# Patient Record
Sex: Male | Born: 1950
Health system: Southern US, Community
[De-identification: ages and names within clinical notes are randomized; demographics above are authoritative.]

## PROBLEM LIST (undated history)

## (undated) DIAGNOSIS — F419 Anxiety disorder, unspecified: Secondary | ICD-10-CM

## (undated) DIAGNOSIS — E119 Type 2 diabetes mellitus without complications: Secondary | ICD-10-CM

## (undated) DIAGNOSIS — M3501 Sicca syndrome with keratoconjunctivitis: Secondary | ICD-10-CM

## (undated) DIAGNOSIS — H16221 Keratoconjunctivitis sicca, not specified as Sjogren's, right eye: Secondary | ICD-10-CM

## (undated) DIAGNOSIS — H169 Unspecified keratitis: Secondary | ICD-10-CM

## (undated) DIAGNOSIS — J302 Other seasonal allergic rhinitis: Secondary | ICD-10-CM

## (undated) DIAGNOSIS — J189 Pneumonia, unspecified organism: Secondary | ICD-10-CM

## (undated) HISTORY — PX: KNEE ARTHROSCOPY: SUR90

## (undated) HISTORY — DX: Type 2 diabetes mellitus without complications: E11.9

## (undated) HISTORY — PX: CYST REMOVAL LEG: SHX6280

## (undated) HISTORY — PX: RECONSTRUCTION OF NOSE: SHX2301

---
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
## 1981-11-27 HISTORY — PX: REPAIR TENDONS LEG: SUR1210

## 1994-11-27 HISTORY — PX: HAND SURGERY: SHX662

## 1996-11-27 HISTORY — PX: ANAL FISSURE REPAIR: SHX2312

## 2002-07-11 ENCOUNTER — Ambulatory Visit (HOSPITAL_COMMUNITY): Admission: RE | Admit: 2002-07-11 | Discharge: 2002-07-11 | Payer: Self-pay | Admitting: *Deleted

## 2002-07-11 ENCOUNTER — Encounter: Payer: Self-pay | Admitting: *Deleted

## 2002-08-13 ENCOUNTER — Encounter: Payer: Self-pay | Admitting: *Deleted

## 2002-08-13 ENCOUNTER — Ambulatory Visit (HOSPITAL_COMMUNITY): Admission: RE | Admit: 2002-08-13 | Discharge: 2002-08-13 | Payer: Self-pay | Admitting: *Deleted

## 2002-08-29 ENCOUNTER — Encounter: Admission: RE | Admit: 2002-08-29 | Discharge: 2002-08-29 | Payer: Self-pay | Admitting: *Deleted

## 2002-08-29 ENCOUNTER — Encounter: Payer: Self-pay | Admitting: *Deleted

## 2002-09-12 ENCOUNTER — Encounter: Payer: Self-pay | Admitting: *Deleted

## 2002-09-12 ENCOUNTER — Encounter: Admission: RE | Admit: 2002-09-12 | Discharge: 2002-09-12 | Payer: Self-pay | Admitting: *Deleted

## 2002-09-26 ENCOUNTER — Encounter: Admission: RE | Admit: 2002-09-26 | Discharge: 2002-09-26 | Payer: Self-pay | Admitting: *Deleted

## 2002-09-26 ENCOUNTER — Encounter: Payer: Self-pay | Admitting: *Deleted

## 2003-02-02 ENCOUNTER — Inpatient Hospital Stay (HOSPITAL_COMMUNITY): Admission: EM | Admit: 2003-02-02 | Discharge: 2003-02-04 | Payer: Self-pay

## 2003-02-02 ENCOUNTER — Encounter: Payer: Self-pay | Admitting: *Deleted

## 2003-02-03 ENCOUNTER — Encounter: Payer: Self-pay | Admitting: *Deleted

## 2003-02-07 ENCOUNTER — Ambulatory Visit (HOSPITAL_COMMUNITY): Admission: RE | Admit: 2003-02-07 | Discharge: 2003-02-07 | Payer: Self-pay | Admitting: Neurology

## 2003-02-07 ENCOUNTER — Encounter: Payer: Self-pay | Admitting: Neurology

## 2004-07-20 ENCOUNTER — Encounter: Admission: RE | Admit: 2004-07-20 | Discharge: 2004-07-20 | Payer: Self-pay | Admitting: Orthopaedic Surgery

## 2004-08-04 ENCOUNTER — Encounter: Admission: RE | Admit: 2004-08-04 | Discharge: 2004-08-04 | Payer: Self-pay | Admitting: Orthopaedic Surgery

## 2005-06-09 ENCOUNTER — Encounter: Admission: RE | Admit: 2005-06-09 | Discharge: 2005-06-09 | Payer: Self-pay | Admitting: Orthopaedic Surgery

## 2005-06-30 ENCOUNTER — Encounter: Admission: RE | Admit: 2005-06-30 | Discharge: 2005-06-30 | Payer: Self-pay | Admitting: Orthopaedic Surgery

## 2006-03-19 ENCOUNTER — Ambulatory Visit: Payer: Self-pay | Admitting: Gastroenterology

## 2008-05-11 ENCOUNTER — Ambulatory Visit (HOSPITAL_BASED_OUTPATIENT_CLINIC_OR_DEPARTMENT_OTHER): Admission: RE | Admit: 2008-05-11 | Discharge: 2008-05-11 | Payer: Self-pay | Admitting: Surgery

## 2008-05-11 ENCOUNTER — Encounter (INDEPENDENT_AMBULATORY_CARE_PROVIDER_SITE_OTHER): Payer: Self-pay | Admitting: Surgery

## 2011-04-11 NOTE — Op Note (Signed)
NAME:  Robert Garner, Robert Garner NO.:  [id number]   MEDICAL RECORD NO.:  [id number]          PATIENT TYPE:  AMB   LOCATION:  DSC                          FACILITY:  [facility]   PHYSICIAN:  Wilmon Arms. Corliss Skains, M.D. DATE OF BIRTH:  [birth year]   DATE OF PROCEDURE:  05/11/2008  DATE OF DISCHARGE:                               OPERATIVE REPORT   PREOPERATIVE DIAGNOSIS:  Lipoma, left light.   POSTOPERATIVE DIAGNOSIS:  Cyst, left leg.   PROCEDURE:  Excision of cyst, left leg.   SURGEON:  Wilmon Arms. Corliss Skains, MD, FACS   ANESTHESIA:  Local MAC.   INDICATIONS:  The patient is a 60 year old male who presents with a  slowly enlarging mass on his left leg just below the knee.  This has  been present for about 20 years.  It has become uncomfortable.  She  presents for elective excision.   DESCRIPTION OF PROCEDURE:  The patient was brought to the operating  room, placed in the supine position on operating table.  His left leg  was shaved and prepped with Betadine and draped in sterile fashion.  He  was given some intravenous sedation.  We anesthetized the area around  the mass, which was on the lateral side of his left leg just below the  patella.  We used 0.25% Marcaine with epinephrine.  I made a  longitudinal incision over the mass.  Dissection was carried down into  the subcutaneous tissues.  We bluntly dissected around the fatty mass.  It did not appear to be a lipoma.  It seemed to be a blood filled cyst.  We were able to remove in its entirety.  There did not seem to be any  feeding vessels into this area.  The mass was sent for pathologic  examination.  The wound was inspected for hemostasis.  I placed some  deep dermal 4-0 Monocryl sutures and closed the skin with Dermabond.  The patient was then awakened, brought to recovery in stable condition.  All sponge, instrument, and needle were counts correct.      Wilmon Arms. Tsuei, M.D.  Electronically Signed     MKT/MEDQ  D:   05/11/2008  T:  05/12/2008  Job:  [id number]

## 2011-04-14 NOTE — Cardiovascular Report (Signed)
   NAME:  Robert Garner, GRUNDMAN NO.:  [id number]   MEDICAL RECORD NO.:  [id number]                   PATIENT TYPE:  INP   LOCATION:  4743                                 FACILITY:  [facility]   PHYSICIAN:  Madaline Savage, M.D.             DATE OF BIRTH:  Oct 21, 1951   DATE OF PROCEDURE:  02/02/2003  DATE OF DISCHARGE:  02/04/2003                              CARDIAC CATHETERIZATION   PROCEDURES PERFORMED:  1. Selective coronary angiography by Judkins technique.  2. Retrograde left heart catheterization.  3. Left ventricular angiography.  4. Abdominal aortography.   COMPLICATIONS:  None.   ENTRY SITE:  Right femoral.   DYE USED:  Omnipaque.   PATIENT PROFILE:  The patient is a 60 year old white male with the onset of  chest pain three days prior to his current admission.  He had chest pain in  multiple locations.  It was subscapular, some trapezius, and it did hurt him  to take a deep breath.  He had a history of tobacco use and a brother age 71  died in a motor vehicle accident and was thought to have had a heart attack  while driving.  The patient was brought to the catheterization laboratory as  an outpatient observation patient for cardiac catheterization performed from  the right groin with 6-French catheters without complications.   RESULTS:  1. Coronary arteries normal.  2. Left ventricular function normal.  Ejection fraction 60%.  3. Normal ascending aorta, descending aorta with no evidence of dissection.  4. Successful Perclose arteriotomy.   PLAN:  The patient will undergo ventilation perfusion scanning tonight to  rule out the possibility of a pulmonary embolus.                                               Madaline Savage, M.D.    WHG/MEDQ  D:  05/06/2003  T:  05/06/2003  Job:  [id number]   cc:   Cath Lab

## 2011-04-14 NOTE — H&P (Signed)
NAME:  Robert Garner, WALTHER NO.:  [id number]   MEDICAL RECORD NO.:  [id number]                   PATIENT TYPE:  INP   LOCATION:  4743                                 FACILITY:  [facility]   PHYSICIAN:  Madaline Savage, M.D.             DATE OF BIRTH:  05/04/1951   DATE OF ADMISSION:  02/02/2003  DATE OF DISCHARGE:  02/04/2003                                HISTORY & PHYSICAL   HISTORY AND HOSPITAL COURSE:  The patient is a 60 year old white male  patient who had the onset of left chest pain three days prior to coming to  the emergency room.  The pain apparently had waxed and waned for several  days, was initially in the left upper pectoral and axillary and then left  subscapular and then left trapezius area.  It felt like a hot nerve.  His  cardiac  risk factors include a two pack per day tobacco use and a  significant family history with a brother who died of an MI at age 56.  Thus, it was decided the patient should undergo cardiac catheterization. He  had a 2-dimensional echocardiogram with VQ scan also ordered.  He had IV  nitroglycerin and IV beta blockers.  Apparently the VQ scan was negative.  He underwent cardiac catheterization by Dr. Chanda Busing on December 05, 2002. He had normal coronaries, normal LV, normal aorta, ascending and  descending and no dissection.  He had a Perclose procedure. His VQ scan was  done that evening on February 02, 2003.  On February 03, 2003 he was still having a  lot of musculoskeletal pain.  He had been seeing an orthopedic, Dr. Humberto Leep.  [place]. He had low back problems with degenerative joint disease and disc  bulge and had injections.  He also had evidence of cervical spine DJD, thus  it was decided that orthopedist should be called in and thus a consultation  was called with Dr. Lajoyce Corners who came in.  He had x-rays which showed  degenerative disc disease in C5-C6 and C6-C7. No redness or fluctuation was  found on Dr. Audrie Lia  examination, thus he would follow up as an outpatient  with an MRI.  On the morning of February 04, 2003 he still had some neck and  shoulder pain.  It was decided to send him home on medications.   LABORATORY DATA:  Sodium is 138, potassium is 3.7, glucose is 130, BUN 16,  creatinine 1.0, hemoglobin 15.1, hematocrit 43.7.  His CK-MB's were negative  X3, troponin's were negative X3.  His HDL was 38, LDL 135, triglycerides  205, total cholesterol was 214, thus he had an atherogenic lipid profile.  Chest x-ray showed negative for acute pulmonary process.   DISCHARGE MEDICATIONS:  1. Lipitor 20 mg one daily.  2. Vioxx 25 mg once per day X2 weeks.  3. Aspirin 81 mg once per day.  4. Skelaxin 800 mg four times per day, every six hours.   ACTIVITY:  He should take it easy for the rest of the week.  No strenuous  activity, lifting, pulling or etc.   DIET:  He should be on a low carbohydrate diet.  He should eat good fats. He  should stay away from foods that have partially hydrogenated, hydrogenated  fats in them.   FOLLOW UP:  The patient will follow up with Abelino Derrick, P.A. on February 16, 2003 at Dr. Truett Perna office and then he will follow up with Dr. Lajoyce Corners.  He will call to schedule an MRI.   DISCHARGE DIAGNOSES:  1. Chest pain,  nonischemic, status post catheterization with normal     coronaries, normal left ventricular function.  2. Cervical neck degenerative disc disease, see by Dr. Lajoyce Corners in the hospital     for follow up MRI as an outpatient, thus his pain was thought to be     related to his cervical neck disease.  3. History of degenerative disc disease in his lumbar spine, status post     injections.  4. Atherogenic lipid profile, now being treated with premature family     history of coronary disease, also tobacco use for cardiac risk factors.  5. Tobacco use.  6. Bradycardia on Lopressor, discontinued at time of discharge.     Lezlie Octave, N.P.                         Madaline Savage, M.D.    BB/MEDQ  D:  02/04/2003  T:  02/04/2003  Job:  [id number]   cc:   Nadara Mustard, M.D.  [address]  [place]  [place] [address]  Fax: [phone]

## 2013-10-27 DIAGNOSIS — J189 Pneumonia, unspecified organism: Secondary | ICD-10-CM

## 2013-10-27 HISTORY — DX: Pneumonia, unspecified organism: J18.9

## 2014-01-12 ENCOUNTER — Other Ambulatory Visit: Payer: Self-pay | Admitting: Family Medicine

## 2014-01-12 DIAGNOSIS — J189 Pneumonia, unspecified organism: Secondary | ICD-10-CM

## 2014-02-04 ENCOUNTER — Ambulatory Visit
Admission: RE | Admit: 2014-02-04 | Discharge: 2014-02-04 | Disposition: A | Discharge status: Home / Self Care | Payer: BC Managed Care – PPO | Source: Ambulatory Visit | Attending: Family Medicine | Admitting: Family Medicine

## 2014-02-04 DIAGNOSIS — J189 Pneumonia, unspecified organism: Secondary | ICD-10-CM

## 2014-02-06 ENCOUNTER — Other Ambulatory Visit (HOSPITAL_COMMUNITY): Payer: Self-pay | Admitting: Family Medicine

## 2014-02-06 DIAGNOSIS — R229 Localized swelling, mass and lump, unspecified: Principal | ICD-10-CM

## 2014-02-06 DIAGNOSIS — IMO0002 Reserved for concepts with insufficient information to code with codable children: Secondary | ICD-10-CM

## 2014-02-09 ENCOUNTER — Encounter: Payer: Self-pay | Admitting: *Deleted

## 2014-02-09 NOTE — [facility] Oncology Navigator Note (Unsigned)
Pt called left vm message to call.  I called with [place] appt.  He verbalized understanding time and place of appt

## 2014-02-16 ENCOUNTER — Ambulatory Visit (HOSPITAL_COMMUNITY)
Admission: RE | Admit: 2014-02-16 | Discharge: 2014-02-16 | Disposition: A | Discharge status: Home / Self Care | Payer: BC Managed Care – PPO | Source: Ambulatory Visit | Attending: Family Medicine | Admitting: Family Medicine

## 2014-02-16 DIAGNOSIS — K7689 Other specified diseases of liver: Secondary | ICD-10-CM | POA: Insufficient documentation

## 2014-02-16 DIAGNOSIS — N433 Hydrocele, unspecified: Secondary | ICD-10-CM | POA: Insufficient documentation

## 2014-02-16 DIAGNOSIS — IMO0002 Reserved for concepts with insufficient information to code with codable children: Secondary | ICD-10-CM

## 2014-02-16 DIAGNOSIS — R229 Localized swelling, mass and lump, unspecified: Secondary | ICD-10-CM

## 2014-02-16 DIAGNOSIS — R222 Localized swelling, mass and lump, trunk: Secondary | ICD-10-CM | POA: Insufficient documentation

## 2014-02-16 DIAGNOSIS — I2510 Atherosclerotic heart disease of native coronary artery without angina pectoris: Secondary | ICD-10-CM | POA: Insufficient documentation

## 2014-02-16 DIAGNOSIS — J438 Other emphysema: Secondary | ICD-10-CM | POA: Insufficient documentation

## 2014-02-16 LAB — GLUCOSE, CAPILLARY: Glucose-Capillary: 116 mg/dL — ABNORMAL HIGH (ref 70–99)

## 2014-02-16 MED ORDER — FLUDEOXYGLUCOSE F - 18 (FDG) INJECTION
9.4000 | Freq: Once | INTRAVENOUS | Status: AC | PRN
  Administered 2014-02-16: 9.4 via INTRAVENOUS

## 2014-02-19 ENCOUNTER — Encounter: Payer: Self-pay | Admitting: Thoracic Surgery (Cardiothoracic Vascular Surgery)

## 2014-02-19 ENCOUNTER — Ambulatory Visit (INDEPENDENT_AMBULATORY_CARE_PROVIDER_SITE_OTHER): Payer: BC Managed Care – PPO | Admitting: Thoracic Surgery (Cardiothoracic Vascular Surgery)

## 2014-02-19 VITALS — BP 152/76 | HR 77 | Temp 97.4°F | Resp 18 | Ht 68.0 in | Wt 193.2 lb

## 2014-02-19 DIAGNOSIS — R918 Other nonspecific abnormal finding of lung field: Secondary | ICD-10-CM | POA: Insufficient documentation

## 2014-02-19 NOTE — Progress Notes (Signed)
PCP is  Melinda Crutch, MD Referring Provider is Melinda Crutch, MD  No chief complaint on file.   HPI: 63 yo male with a >30 pack year history of tobacco abuse. He presented with fever, chills and productive cough in December. A CXR showed a left lower lobe infiltrate. He was treated with antibiotics and his symptoms resolved. A follow up CXR showed a persistent infiltrate so a CT was done on 02/04/2014. It showed a 5 cm left upper lobe mass with solid and sub-solid components, suspicious for cancer. This was followed by a PET/CT which showed the lesion to be more amorphous and less mass-like. The SUV was only 2.3 and it was suggested this may in fact be post infectious.  He denies weight loss. He does have an occasional dry cough. He denies hemoptysis. He has some blurred vision secondary to keratitis in his right eye but no double vision. 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 He does c/o arthritis and joint pains. He has noted blood in his stool. HE denies wheezing but is on singulair. He denies CP, pressure or tightness with exertion.  Past Medical History  Diagnosis Date  . COPD (chronic obstructive pulmonary disease)     History reviewed. No pertinent past surgical history.  Family History  Problem Relation Age of Onset  . Alcohol abuse Mother   . Cancer Father   . Alcohol abuse Maternal Grandfather   . Hypertension Paternal Grandfather     Social History History  Substance Use Topics  . Smoking status: Current Every Day Smoker -- 1.00 packs/day for 40 years  . Smokeless tobacco: Not on file  . Alcohol Use: 0.0 oz/week    4-6 Cans of beer per week    Current Outpatient Prescriptions  Medication Sig Dispense Refill  . montelukast (SINGULAIR) 10 MG tablet Take 10 mg by mouth at bedtime.      . sertraline (ZOLOFT) 50 MG tablet Take 50 mg by mouth at bedtime.       No current facility-administered medications for this visit.    Allergies  no known allergies  Review of Systems  Constitutional: Positive for unexpected weight change (gained several pounds).  Respiratory: Positive for cough. Negative for chest tightness, shortness of breath and wheezing.   Cardiovascular: Negative for chest pain and leg swelling.  Gastrointestinal: Positive for blood in stool.  Musculoskeletal: Positive for arthralgias and joint swelling.  Neurological: Positive for syncope (3 occassions all associated with sneezing or coughing).  Psychiatric/Behavioral: The patient is nervous/anxious.   All other systems reviewed and are negative.    BP 152/76  Pulse 77  Temp(Src) 97.4 F (36.3 C)  Resp 18  Ht 5\' 8"  (1.727 m)  Wt 193 lb 3.2 oz (87.635 kg)  BMI 29.38 kg/m2 Physical Exam  Vitals reviewed. Constitutional: He is oriented to person, place, and time. He appears well-developed and well-nourished. No distress.  HENT:  Head: Normocephalic and atraumatic.  Eyes: EOM are normal. Pupils are equal, round, and reactive to light.  Neck: Neck supple. No thyromegaly present.  Cardiovascular: Normal rate, regular rhythm and normal heart sounds.   No murmur heard. Pulmonary/Chest: Effort normal. He has no wheezes.  Coarse BS on left  Abdominal: Soft. There is no tenderness.  Musculoskeletal: He exhibits no edema.  Lymphadenopathy:    He has no cervical adenopathy.  Neurological: He is alert and oriented to person, place, and time. No cranial nerve deficit.  Skin:  Skin is warm and dry.     Diagnostic Tests: CT CHEST WITHOUT CONTRAST  TECHNIQUE  Multidetector CT imaging of the chest was performed following the  standard protocol without IV contrast.  COMPARISON  None.  FINDINGS  Mediastinum: Heart size is normal. There is no significant  pericardial fluid, thickening or pericardial calcification. There is  atherosclerosis of the thoracic aorta, the great vessels of the  mediastinum and the coronary arteries, including calcified   atherosclerotic plaque in the left anterior descending coronary  arteries. No pathologically enlarged mediastinal or hilar lymph  nodes. Please note that accurate exclusion of hilar adenopathy is  limited on noncontrast CT scans. Esophagus is unremarkable in  appearance.  Lungs/Pleura: Left upper lobe masslike opacity abutting the major  fissure has both solid and subsolid components. The subsolid  component consists of ground-glass attenuation and extensive septal  thickening and measures up to 5.2 x 5.1 cm (image 38 of series 3),  with multiple internal solid components, largest of which measures  up to approximately 14 mm (image 36 of series 2). There is extensive  surrounding architectural distortion and thickening of the adjacent  left major fissure. Mild diffuse bronchial wall thickening with  moderate centrilobular and paraseptal emphysema. No acute  consolidative airspace disease. No pleural effusions.  Upper Abdomen: Diffuse low attenuation throughout the hepatic  parenchyma, compatible with hepatic steatosis.  Musculoskeletal: There are no aggressive appearing lytic or blastic  lesions noted in the visualized portions of the skeleton.  IMPRESSION  1. Aggressive appearing mixed solid and sub solid mass like opacity  in the left upper lobe is concerning for potential neoplasm such as  an adenocarcinoma. Correlation with PET-CT is strongly recommended  at this time.  2. No definite mediastinal or hilar adenopathy noted on today's non  contrast CT examination.  3. Mild diffuse bronchial wall thickening with moderate  centrilobular and paraseptal emphysema; imaging findings suggestive  of underlying COPD.  4. Atherosclerosis, including left anterior descending coronary  artery disease. Please note that although the presence of coronary  artery calcium documents the presence of coronary artery disease,  the severity of this disease and any potential stenosis cannot be  assessed  on this non-gated CT examination. Assessment for potential  risk factor modification, dietary therapy or pharmacologic therapy  may be warranted, if clinically indicated.  5. Hepatic steatosis.  These results will be called to the ordering clinician or  representative by the Radiologist Assistant, and communication  documented in the PACS Dashboard.  SIGNATURE  Electronically Signed  By: Vinnie Langton M.D.  On: 02/04/2014 16:43   NUCLEAR MEDICINE PET SKULL BASE TO THIGH  TECHNIQUE:  9.4 mCi F-18 FDG was injected intravenously. Full-ring PET imaging  was performed from the skull base to thigh after the radiotracer. CT  data was obtained and used for attenuation correction and anatomic  localization.  FASTING BLOOD GLUCOSE: Value: 116 mg/dl  COMPARISON: CT CHEST W/O CM dated 02/04/2014  FINDINGS:  NECK  No hypermetabolic lymph nodes in the neck. CT images show no acute  findings. Mucosal thickening in the maxillary sinuses bilaterally.  No air-fluid levels. Mastoid air cells are clear.  CHEST  A patchy area of ground-glass and nodular consolidation in the  posterior aspect of the left upper lobe appears more a amorphous  than on the prior study of 02/04/2014 and has low level  hypermetabolism, SUV max 2.3. Overall CT appearance may be slightly  improved. No hypermetabolic mediastinal or hilar lymph nodes.  CT images show atherosclerotic calcification of the arterial  vasculature, including coronary arteries. Heart size within normal  limits. No pericardial effusion.  Emphysema. No pleural fluid.  ABDOMEN/PELVIS  Focal hypermetabolism is associated with the rectum, with an SUV max  of 7.3. No additional areas of abnormal hypermetabolism in the  liver, adrenal glands, spleen or pancreas. No hypermetabolic lymph  nodes.  CT images show low attenuation throughout the visualized portion of  the liver. Gallbladder and adrenal glands are unremarkable.  Low-attenuation lesions in the  kidneys measure up to 4.4 cm and are  difficult to further characterize in the absence of postcontrast  imaging. Kidneys, spleen, pancreas, stomach and bowel are otherwise  grossly unremarkable. Bladder wall thickening may be due to  underdistention. Atherosclerotic calcification of the arterial  vasculature without abdominal aortic aneurysm. Right scrotal  hydrocele.  SKELETON  No hypermetabolic osseous lesions.  IMPRESSION:  1. Patchy area ground-glass and nodular consolidation in the  posterior aspect of the left upper lobe appears slightly less  masslike than on the prior examination. Borderline hypermetabolism  is seen in association. Findings may be post infectious in etiology.  However, adenocarcinoma cannot be excluded. CT chest without  contrast in 4 weeks is recommended in further evaluation.  2. Focal hypermetabolism associated with the rectum. Difficult to  exclude a malignant lesion.  3. Coronary artery calcification.  4. Hepatic steatosis.  5. Large right scrotal hydrocele.  Electronically Signed  By: Lorin Picket M.D.  On: 02/16/2014 15:29   Impression: 63 yo man with a significant history of tobacco abuse, who has a persistent left upper lobe infiltrate following a pneumonia in December. By CT there was a mixed solid- subsolid mass suspicious for cancer. 2 weeks later on his PET it was felt by Radiology to be more amorphous and less mass-like. The SUV was relatively low.  The differential diagnosis includes a low grade adenocarcinoma as well as benign etiologies, such as resolving pneumonia or BOOP. I discussed 3 options with Mr. and Mrs. Jerelene Redden. The options are continued radiographic follow up, bronchscopy and surgical resection. Of those, I would favor bronchoscopy. I am not entirely comfortable with just following radiographically as it has already been 3 months. By the same token proceeding to surgery is probably unnecessary given that it may have improved. I would  be more comfortable following radiographically if we had a negative biopsy to go along with it.  I think with navigational bronchoscopy we can target specific areas of the lesion and this should give [place] a good idea of what we are dealing with. They do understand that there is a possibility of a false negative biopsy.  I discussed the pro[posed procedure- electromagnetic navigational bronchoscopy with biopsy with the [facility]'. They understand we would do this in the OR under general anesthesia. We would expect him to go home the day of the procedure.  They understand the indications, risks, benefits ad alternatives. They understand the risks include those associated with general anesthesia, as well as procedure specific risks such as bleeding, pneumothorax, and failure to make a diagnosis.  He accepts the risks and wishes to proceed.  Plan: ENB in [place] on Friday 4/10

## 2014-02-20 ENCOUNTER — Other Ambulatory Visit: Payer: Self-pay

## 2014-02-20 DIAGNOSIS — R918 Other nonspecific abnormal finding of lung field: Secondary | ICD-10-CM

## 2014-02-23 ENCOUNTER — Encounter (HOSPITAL_COMMUNITY): Payer: Self-pay

## 2014-02-27 NOTE — Pre-Procedure Instructions (Signed)
Robert Garner  02/27/2014   Your procedure is scheduled on:  03/06/14 Friday at 0730   Report to [facility] short stay admitting at 530 AM.   Call this number if you have problems the morning of surgery: [phone]   Remember:   Do not eat food or drink liquids after midnight.Thursday night    Take these medicines the morning of surgery with A SIP OF WATER: SINGULAIR, ZOLOFT, VALTREX IF NEEDED        STOP all herbel meds, nsaids (aleve,naproxen,advil,ibuprofen) NOW INCLUDING VITAMIN, FISH OIL, CRANBERRY,VIT E   Do not wear jewelry.  Do not wear lotions, powders, or perfumes. You may wear deodorant.  Do not shave 48 hours prior to surgery. Men may shave face and neck.  Do not bring valuables to the hospital.  [facility] is not responsible  for any belongings or valuables.               Contacts, dentures or bridgework may not be worn into surgery.                     Patients discharged the day of surgery will not be allowed to drive  home.  Name and phone number of your driver:      Special Instructions:  Special Instructions: [place] - Preparing for Surgery  Before surgery, you can play an important role.  Because skin is not sterile, your skin needs to be as free of germs as possible.  You can reduce the number of germs on you skin by washing with CHG (chlorahexidine gluconate) soap before surgery.  CHG is an antiseptic cleaner which kills germs and bonds with the skin to continue killing germs even after washing.  Please DO NOT use if you have an allergy to CHG or antibacterial soaps.  If your skin becomes reddened/irritated stop using the CHG and inform your nurse when you arrive at Short Stay.  Do not shave (including legs and underarms) for at least 48 hours prior to the first CHG shower.  You may shave your face.  Please follow these instructions carefully:   1.  Shower with CHG Soap the night before surgery and the morning of Surgery.  2.  If you choose to  wash your hair, wash your hair first as usual with your normal shampoo.  3.  After you shampoo, rinse your hair and body thoroughly to remove the Shampoo.  4.  Use CHG as you would any other liquid soap.  You can apply chg directly  to the skin and wash gently with scrungie or a clean washcloth.  5.  Apply the CHG Soap to your body ONLY FROM THE NECK DOWN.  Do not use on open wounds or open sores.  Avoid contact with your eyes ears, mouth and genitals (private parts).  Wash genitals (private parts)  with your normal soap.  6.  Wash thoroughly, paying special attention to the area where your surgery will be performed.  7.  Thoroughly rinse your body with warm water from the neck down.  8.  DO NOT shower/wash with your normal soap after using and rinsing off the CHG Soap.  9.  Pat yourself dry with a clean towel.            10.  Wear clean pajamas.            11.  Place clean sheets on your bed the night of your first shower and do  not sleep with pets.  Day of Surgery  Do not apply any lotions/deodorants the morning of surgery.  Please wear clean clothes to the hospital/surgery center.   Please read over the following fact sheets that you were given: Pain Booklet, Coughing and Deep Breathing and Surgical Site Infection Prevention

## 2014-03-02 ENCOUNTER — Encounter (HOSPITAL_COMMUNITY): Payer: Self-pay

## 2014-03-02 ENCOUNTER — Encounter (HOSPITAL_COMMUNITY)
Admission: RE | Admit: 2014-03-02 | Discharge: 2014-03-02 | Disposition: A | Discharge status: Home / Self Care | Payer: BC Managed Care – PPO | Source: Ambulatory Visit | Attending: Thoracic Surgery (Cardiothoracic Vascular Surgery) | Admitting: Thoracic Surgery (Cardiothoracic Vascular Surgery)

## 2014-03-02 VITALS — BP 154/82 | HR 63 | Temp 98.4°F | Resp 18 | Ht 68.5 in | Wt 192.2 lb

## 2014-03-02 DIAGNOSIS — Z01812 Encounter for preprocedural laboratory examination: Secondary | ICD-10-CM | POA: Insufficient documentation

## 2014-03-02 DIAGNOSIS — R918 Other nonspecific abnormal finding of lung field: Secondary | ICD-10-CM

## 2014-03-02 HISTORY — DX: Anxiety disorder, unspecified: F41.9

## 2014-03-02 HISTORY — DX: Sjogren syndrome with keratoconjunctivitis: M35.01

## 2014-03-02 HISTORY — DX: Pneumonia, unspecified organism: J18.9

## 2014-03-02 HISTORY — DX: Keratoconjunctivitis sicca, not specified as sjogren's, right eye: H16.221

## 2014-03-02 HISTORY — DX: Unspecified keratitis: H16.9

## 2014-03-02 HISTORY — DX: Other seasonal allergic rhinitis: J30.2

## 2014-03-02 LAB — PROTIME-INR
INR: 1.05 (ref 0.00–1.49)
PROTHROMBIN TIME: 13.5 s (ref 11.6–15.2)

## 2014-03-02 LAB — CBC
HCT: 49.8 % (ref 39.0–52.0)
Hemoglobin: 17.7 g/dL — ABNORMAL HIGH (ref 13.0–17.0)
MCH: 36.3 pg — AB (ref 26.0–34.0)
MCHC: 35.5 g/dL (ref 30.0–36.0)
MCV: 102.3 fL — ABNORMAL HIGH (ref 78.0–100.0)
Platelets: 216 10*3/uL (ref 150–400)
RBC: 4.87 MIL/uL (ref 4.22–5.81)
RDW: 13.9 % (ref 11.5–15.5)
WBC: 8.9 10*3/uL (ref 4.0–10.5)

## 2014-03-02 LAB — COMPREHENSIVE METABOLIC PANEL
ALBUMIN: 3.7 g/dL (ref 3.5–5.2)
ALK PHOS: 104 U/L (ref 39–117)
ALT: 33 U/L (ref 0–53)
AST: 32 U/L (ref 0–37)
BUN: 17 mg/dL (ref 6–23)
CO2: 27 mEq/L (ref 19–32)
Calcium: 9 mg/dL (ref 8.4–10.5)
Chloride: 100 mEq/L (ref 96–112)
Creatinine, Ser: 1.04 mg/dL (ref 0.50–1.35)
GFR calc non Af Amer: 76 mL/min — ABNORMAL LOW (ref >90)
GFR, EST AFRICAN AMERICAN: 88 mL/min — AB (ref >90)
GLUCOSE: 118 mg/dL — AB (ref 70–99)
POTASSIUM: 4.6 meq/L (ref 3.7–5.3)
SODIUM: 141 meq/L (ref 137–147)
TOTAL PROTEIN: 6.9 g/dL (ref 6.0–8.3)
Total Bilirubin: 0.2 mg/dL — ABNORMAL LOW (ref 0.3–1.2)

## 2014-03-02 LAB — APTT: APTT: 33 s (ref 24–37)

## 2014-03-06 ENCOUNTER — Ambulatory Visit (HOSPITAL_COMMUNITY)
Admission: RE | Admit: 2014-03-06 | Discharge: 2014-03-06 | Disposition: A | Discharge status: Home / Self Care | Payer: BC Managed Care – PPO | Source: Ambulatory Visit | Attending: Thoracic Surgery (Cardiothoracic Vascular Surgery) | Admitting: Thoracic Surgery (Cardiothoracic Vascular Surgery)

## 2014-03-06 ENCOUNTER — Ambulatory Visit (HOSPITAL_COMMUNITY): Payer: BC Managed Care – PPO

## 2014-03-06 ENCOUNTER — Encounter (HOSPITAL_COMMUNITY): Payer: BC Managed Care – PPO | Admitting: Certified Registered Nurse Anesthetist

## 2014-03-06 ENCOUNTER — Encounter (HOSPITAL_COMMUNITY): Payer: Self-pay | Admitting: Surgery

## 2014-03-06 ENCOUNTER — Ambulatory Visit (HOSPITAL_COMMUNITY): Payer: BC Managed Care – PPO | Admitting: Certified Registered Nurse Anesthetist

## 2014-03-06 ENCOUNTER — Encounter (HOSPITAL_COMMUNITY)
Admission: RE | Disposition: A | Discharge status: Home / Self Care | Payer: Self-pay | Source: Ambulatory Visit | Attending: Thoracic Surgery (Cardiothoracic Vascular Surgery)

## 2014-03-06 DIAGNOSIS — R222 Localized swelling, mass and lump, trunk: Secondary | ICD-10-CM

## 2014-03-06 DIAGNOSIS — R918 Other nonspecific abnormal finding of lung field: Secondary | ICD-10-CM

## 2014-03-06 DIAGNOSIS — J4489 Other specified chronic obstructive pulmonary disease: Secondary | ICD-10-CM | POA: Insufficient documentation

## 2014-03-06 DIAGNOSIS — F17200 Nicotine dependence, unspecified, uncomplicated: Secondary | ICD-10-CM | POA: Insufficient documentation

## 2014-03-06 DIAGNOSIS — J449 Chronic obstructive pulmonary disease, unspecified: Secondary | ICD-10-CM | POA: Insufficient documentation

## 2014-03-06 HISTORY — PX: VIDEO BRONCHOSCOPY WITH ENDOBRONCHIAL NAVIGATION: SHX6175

## 2014-03-06 SURGERY — VIDEO BRONCHOSCOPY WITH ENDOBRONCHIAL NAVIGATION
Anesthesia: General | Site: Bronchus

## 2014-03-06 MED ORDER — FENTANYL CITRATE 0.05 MG/ML IJ SOLN
INTRAMUSCULAR | Status: AC
  Filled 2014-03-06: qty 5

## 2014-03-06 MED ORDER — ONDANSETRON HCL 4 MG/2ML IJ SOLN
INTRAMUSCULAR | Status: AC
  Filled 2014-03-06: qty 2

## 2014-03-06 MED ORDER — LIDOCAINE HCL (CARDIAC) 20 MG/ML IV SOLN
INTRAVENOUS | Status: AC
  Filled 2014-03-06: qty 10

## 2014-03-06 MED ORDER — PROPOFOL 10 MG/ML IV BOLUS
INTRAVENOUS | Status: AC
  Filled 2014-03-06: qty 20

## 2014-03-06 MED ORDER — EPHEDRINE SULFATE 50 MG/ML IJ SOLN
INTRAMUSCULAR | Status: DC | PRN
  Administered 2014-03-06 (×4): 5 mg via INTRAVENOUS

## 2014-03-06 MED ORDER — LACTATED RINGERS IV SOLN
INTRAVENOUS | Status: DC | PRN
  Administered 2014-03-06 (×2): via INTRAVENOUS

## 2014-03-06 MED ORDER — 0.9 % SODIUM CHLORIDE (POUR BTL) OPTIME
TOPICAL | Status: DC | PRN
  Administered 2014-03-06: 1000 mL

## 2014-03-06 MED ORDER — ROCURONIUM BROMIDE 100 MG/10ML IV SOLN
INTRAVENOUS | Status: DC | PRN
  Administered 2014-03-06: 40 mg via INTRAVENOUS

## 2014-03-06 MED ORDER — HYDROMORPHONE HCL PF 1 MG/ML IJ SOLN: 0.2500 mg | INTRAMUSCULAR | Status: DC | PRN

## 2014-03-06 MED ORDER — ONDANSETRON HCL 4 MG/2ML IJ SOLN
INTRAMUSCULAR | Status: DC | PRN
  Administered 2014-03-06: 4 mg via INTRAVENOUS

## 2014-03-06 MED ORDER — MIDAZOLAM HCL 2 MG/2ML IJ SOLN
INTRAMUSCULAR | Status: AC
  Filled 2014-03-06: qty 2

## 2014-03-06 MED ORDER — NEOSTIGMINE METHYLSULFATE 1 MG/ML IJ SOLN
INTRAMUSCULAR | Status: DC | PRN
  Administered 2014-03-06: 4 mg via INTRAVENOUS

## 2014-03-06 MED ORDER — GLYCOPYRROLATE 0.2 MG/ML IJ SOLN
INTRAMUSCULAR | Status: AC
  Filled 2014-03-06: qty 4

## 2014-03-06 MED ORDER — EPINEPHRINE HCL 1 MG/ML IJ SOLN
INTRAMUSCULAR | Status: AC
  Filled 2014-03-06: qty 1

## 2014-03-06 MED ORDER — NEOSTIGMINE METHYLSULFATE 1 MG/ML IJ SOLN
INTRAMUSCULAR | Status: AC
  Filled 2014-03-06: qty 10

## 2014-03-06 MED ORDER — MIDAZOLAM HCL 5 MG/5ML IJ SOLN
INTRAMUSCULAR | Status: DC | PRN
  Administered 2014-03-06: 2 mg via INTRAVENOUS

## 2014-03-06 MED ORDER — PROPOFOL 10 MG/ML IV BOLUS
INTRAVENOUS | Status: DC | PRN
  Administered 2014-03-06: 30 mg via INTRAVENOUS
  Administered 2014-03-06: 60 mg via INTRAVENOUS
  Administered 2014-03-06: 140 mg via INTRAVENOUS

## 2014-03-06 MED ORDER — ARTIFICIAL TEARS OP OINT
TOPICAL_OINTMENT | OPHTHALMIC | Status: AC
  Filled 2014-03-06: qty 3.5

## 2014-03-06 MED ORDER — GLYCOPYRROLATE 0.2 MG/ML IJ SOLN
INTRAMUSCULAR | Status: DC | PRN
  Administered 2014-03-06: 0.6 mg via INTRAVENOUS

## 2014-03-06 MED ORDER — LIDOCAINE HCL (CARDIAC) 20 MG/ML IV SOLN
INTRAVENOUS | Status: DC | PRN
  Administered 2014-03-06: 50 mg via INTRAVENOUS
  Administered 2014-03-06: 30 mg via INTRATRACHEAL

## 2014-03-06 MED ORDER — ONDANSETRON HCL 4 MG/2ML IJ SOLN: 4.0000 mg | Freq: Once | INTRAMUSCULAR | Status: DC | PRN

## 2014-03-06 MED ORDER — FENTANYL CITRATE 0.05 MG/ML IJ SOLN
INTRAMUSCULAR | Status: DC | PRN
  Administered 2014-03-06 (×7): 50 ug via INTRAVENOUS

## 2014-03-06 SURGICAL SUPPLY — 32 items
BRUSH SUPERTRAX BIOPSY (INSTRUMENTS) ×1 IMPLANT
BRUSH SUPERTRAX NDL-TIP CYTO (INSTRUMENTS) ×1 IMPLANT
CANISTER SUCTION 2500CC (MISCELLANEOUS) ×2 IMPLANT
CHANNEL WORK EXTEND EDGE 180 (KITS) IMPLANT
CHANNEL WORK EXTEND EDGE 45 (KITS) IMPLANT
CHANNEL WORK EXTEND EDGE 90 (KITS) ×2 IMPLANT
CONT SPEC 4OZ CLIKSEAL STRL BL (MISCELLANEOUS) ×5 IMPLANT
COVER TABLE BACK 60X90 (DRAPES) ×2 IMPLANT
FILTER STRAW FLUID ASPIR (MISCELLANEOUS) IMPLANT
FORCEPS BIOP SUPERTRX PREMAR (INSTRUMENTS) IMPLANT
GLOVE SURG SIGNA 7.5 PF LTX (GLOVE) ×2 IMPLANT
GOWN STRL REUS W/ TWL XL LVL3 (GOWN DISPOSABLE) ×1 IMPLANT
GOWN STRL REUS W/TWL XL LVL3 (GOWN DISPOSABLE) ×4
KIT PROCEDURE EDGE 180 (KITS) IMPLANT
KIT PROCEDURE EDGE 45 (KITS) IMPLANT
KIT PROCEDURE EDGE 90 (KITS) IMPLANT
KIT ROOM TURNOVER OR (KITS) ×2 IMPLANT
MARKER SKIN DUAL TIP RULER LAB (MISCELLANEOUS) ×2 IMPLANT
NDL SUPERTRX PREMARK BIOPSY (NEEDLE) IMPLANT
NEEDLE SUPERTRX PREMARK BIOPSY (NEEDLE) ×2 IMPLANT
NS IRRIG 1000ML POUR BTL (IV SOLUTION) ×2 IMPLANT
OIL SILICONE PENTAX (PARTS (SERVICE/REPAIRS)) ×2 IMPLANT
PAD ARMBOARD 7.5X6 YLW CONV (MISCELLANEOUS) ×4 IMPLANT
PATCHES PATIENT (LABEL) ×6 IMPLANT
SPONGE GAUZE 4X4 12PLY (GAUZE/BANDAGES/DRESSINGS) ×2 IMPLANT
SYR 20CC LL (SYRINGE) ×3 IMPLANT
SYR 20ML ECCENTRIC (SYRINGE) ×3 IMPLANT
SYR 30ML LL (SYRINGE) ×2 IMPLANT
SYR 5ML LL (SYRINGE) ×1 IMPLANT
TOWEL OR 17X24 6PK STRL BLUE (TOWEL DISPOSABLE) ×2 IMPLANT
TRAP SPECIMEN MUCOUS 40CC (MISCELLANEOUS) ×2 IMPLANT
TUBE CONNECTING 12X1/4 (SUCTIONS) ×3 IMPLANT

## 2014-03-06 NOTE — Brief Op Note (Signed)
03/06/2014  9:26 AM  PATIENT:  Leane Call  63 y.o. male  PRE-OPERATIVE DIAGNOSIS:   LUL infiltrate/ mass  POST-OPERATIVE DIAGNOSIS:   LUL infiltrate/ mass  PROCEDURE:  Procedure(s): VIDEO BRONCHOSCOPY WITH ENDOBRONCHIAL NAVIGATION (N/A) Brushings, biopsies, needle aspirations and bronchoalveolar lavage  SURGEON:  Surgeon(s) and Role:    * Melrose Nakayama, MD - Primary   ANESTHESIA:   general  EBL:  Total I/O In: 1000 [I.V.:1000] Out: -   BLOOD ADMINISTERED:none  DRAINS: none   LOCAL MEDICATIONS USED:  NONE  SPECIMEN:  Source of Specimen:  left upper lobe infiltrate/ mass  DISPOSITION OF SPECIMEN:  Path and micro  PLAN OF CARE: Discharge to home after PACU  PATIENT DISPOSITION:  PACU - hemodynamically stable.   Delay start of Pharmacological VTE agent (>24hrs) due to surgical blood loss or risk of bleeding: not applicable  *Quick prep of initial brushings- benign inflammatory changes

## 2014-03-06 NOTE — H&P (View-Only) (Signed)
PCP is  Ross, Alan, MD Referring Provider is Ross, Alan, MD  No chief complaint on file.   HPI: 63 yo male with a >30 pack year history of tobacco abuse. He presented with fever, chills and productive cough in December. A CXR showed a left lower lobe infiltrate. He was treated with antibiotics and his symptoms resolved. A follow up CXR showed a persistent infiltrate so a CT was done on 02/04/2014. It showed a 5 cm left upper lobe mass with solid and sub-solid components, suspicious for cancer. This was followed by a PET/CT which showed the lesion to be more amorphous and less mass-like. The SUV was only 2.3 and it was suggested this may in fact be post infectious.  He denies weight loss. He does have an occasional dry cough. He denies hemoptysis. He has some blurred vision secondary to keratitis in his right eye but no double vision. 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 He does c/o arthritis and joint pains. He has noted blood in his stool. HE denies wheezing but is on singulair. He denies CP, pressure or tightness with exertion.  Past Medical History  Diagnosis Date  . COPD (chronic obstructive pulmonary disease)     History reviewed. No pertinent past surgical history.  Family History  Problem Relation Age of Onset  . Alcohol abuse Mother   . Cancer Father   . Alcohol abuse Maternal Grandfather   . Hypertension Paternal Grandfather     Social History History  Substance Use Topics  . Smoking status: Current Every Day Smoker -- 1.00 packs/day for 40 years  . Smokeless tobacco: Not on file  . Alcohol Use: 0.0 oz/week    4-6 Cans of beer per week    Current Outpatient Prescriptions  Medication Sig Dispense Refill  . montelukast (SINGULAIR) 10 MG tablet Take 10 mg by mouth at bedtime.      . sertraline (ZOLOFT) 50 MG tablet Take 50 mg by mouth at bedtime.       No current facility-administered medications for this visit.    Allergies  no known allergies  Review of Systems  Constitutional: Positive for unexpected weight change (gained several pounds).  Respiratory: Positive for cough. Negative for chest tightness, shortness of breath and wheezing.   Cardiovascular: Negative for chest pain and leg swelling.  Gastrointestinal: Positive for blood in stool.  Musculoskeletal: Positive for arthralgias and joint swelling.  Neurological: Positive for syncope (3 occassions all associated with sneezing or coughing).  Psychiatric/Behavioral: The patient is nervous/anxious.   All other systems reviewed and are negative.    BP 152/76  Pulse 77  Temp(Src) 97.4 F (36.3 C)  Resp 18  Ht 5' 8" (1.727 m)  Wt 193 lb 3.2 oz (87.635 kg)  BMI 29.38 kg/m2 Physical Exam  Vitals reviewed. Constitutional: He is oriented to person, place, and time. He appears well-developed and well-nourished. No distress.  HENT:  Head: Normocephalic and atraumatic.  Eyes: EOM are normal. Pupils are equal, round, and reactive to light.  Neck: Neck supple. No thyromegaly present.  Cardiovascular: Normal rate, regular rhythm and normal heart sounds.   No murmur heard. Pulmonary/Chest: Effort normal. He has no wheezes.  Coarse BS on left  Abdominal: Soft. There is no tenderness.  Musculoskeletal: He exhibits no edema.  Lymphadenopathy:    He has no cervical adenopathy.  Neurological: He is alert and oriented to person, place, and time. No cranial nerve deficit.  Skin:   Skin is warm and dry.     Diagnostic Tests: CT CHEST WITHOUT CONTRAST  TECHNIQUE  Multidetector CT imaging of the chest was performed following the  standard protocol without IV contrast.  COMPARISON  None.  FINDINGS  Mediastinum: Heart size is normal. There is no significant  pericardial fluid, thickening or pericardial calcification. There is  atherosclerosis of the thoracic aorta, the great vessels of the  mediastinum and the coronary arteries, including calcified   atherosclerotic plaque in the left anterior descending coronary  arteries. No pathologically enlarged mediastinal or hilar lymph  nodes. Please note that accurate exclusion of hilar adenopathy is  limited on noncontrast CT scans. Esophagus is unremarkable in  appearance.  Lungs/Pleura: Left upper lobe masslike opacity abutting the major  fissure has both solid and subsolid components. The subsolid  component consists of ground-glass attenuation and extensive septal  thickening and measures up to 5.2 x 5.1 cm (image 38 of series 3),  with multiple internal solid components, largest of which measures  up to approximately 14 mm (image 36 of series 2). There is extensive  surrounding architectural distortion and thickening of the adjacent  left major fissure. Mild diffuse bronchial wall thickening with  moderate centrilobular and paraseptal emphysema. No acute  consolidative airspace disease. No pleural effusions.  Upper Abdomen: Diffuse low attenuation throughout the hepatic  parenchyma, compatible with hepatic steatosis.  Musculoskeletal: There are no aggressive appearing lytic or blastic  lesions noted in the visualized portions of the skeleton.  IMPRESSION  1. Aggressive appearing mixed solid and sub solid mass like opacity  in the left upper lobe is concerning for potential neoplasm such as  an adenocarcinoma. Correlation with PET-CT is strongly recommended  at this time.  2. No definite mediastinal or hilar adenopathy noted on today's non  contrast CT examination.  3. Mild diffuse bronchial wall thickening with moderate  centrilobular and paraseptal emphysema; imaging findings suggestive  of underlying COPD.  4. Atherosclerosis, including left anterior descending coronary  artery disease. Please note that although the presence of coronary  artery calcium documents the presence of coronary artery disease,  the severity of this disease and any potential stenosis cannot be  assessed  on this non-gated CT examination. Assessment for potential  risk factor modification, dietary therapy or pharmacologic therapy  may be warranted, if clinically indicated.  5. Hepatic steatosis.  These results will be called to the ordering clinician or  representative by the Radiologist Assistant, and communication  documented in the PACS Dashboard.  SIGNATURE  Electronically Signed  By: Vinnie Langton M.D.  On: 02/04/2014 16:43   NUCLEAR MEDICINE PET SKULL BASE TO THIGH  TECHNIQUE:  9.4 mCi F-18 FDG was injected intravenously. Full-ring PET imaging  was performed from the skull base to thigh after the radiotracer. CT  data was obtained and used for attenuation correction and anatomic  localization.  FASTING BLOOD GLUCOSE: Value: 116 mg/dl  COMPARISON: CT CHEST W/O CM dated 02/04/2014  FINDINGS:  NECK  No hypermetabolic lymph nodes in the neck. CT images show no acute  findings. Mucosal thickening in the maxillary sinuses bilaterally.  No air-fluid levels. Mastoid air cells are clear.  CHEST  A patchy area of ground-glass and nodular consolidation in the  posterior aspect of the left upper lobe appears more a amorphous  than on the prior study of 02/04/2014 and has low level  hypermetabolism, SUV max 2.3. Overall CT appearance may be slightly  improved. No hypermetabolic mediastinal or hilar lymph nodes.  CT images show atherosclerotic calcification of the arterial  vasculature, including coronary arteries. Heart size within normal  limits. No pericardial effusion.  Emphysema. No pleural fluid.  ABDOMEN/PELVIS  Focal hypermetabolism is associated with the rectum, with an SUV max  of 7.3. No additional areas of abnormal hypermetabolism in the  liver, adrenal glands, spleen or pancreas. No hypermetabolic lymph  nodes.  CT images show low attenuation throughout the visualized portion of  the liver. Gallbladder and adrenal glands are unremarkable.  Low-attenuation lesions in the  kidneys measure up to 4.4 cm and are  difficult to further characterize in the absence of postcontrast  imaging. Kidneys, spleen, pancreas, stomach and bowel are otherwise  grossly unremarkable. Bladder wall thickening may be due to  underdistention. Atherosclerotic calcification of the arterial  vasculature without abdominal aortic aneurysm. Right scrotal  hydrocele.  SKELETON  No hypermetabolic osseous lesions.  IMPRESSION:  1. Patchy area ground-glass and nodular consolidation in the  posterior aspect of the left upper lobe appears slightly less  masslike than on the prior examination. Borderline hypermetabolism  is seen in association. Findings may be post infectious in etiology.  However, adenocarcinoma cannot be excluded. CT chest without  contrast in 4 weeks is recommended in further evaluation.  2. Focal hypermetabolism associated with the rectum. Difficult to  exclude a malignant lesion.  3. Coronary artery calcification.  4. Hepatic steatosis.  5. Large right scrotal hydrocele.  Electronically Signed  By: Melinda Blietz M.D.  On: 02/16/2014 15:29   Impression: 62 yo man with a significant history of tobacco abuse, who has a persistent left upper lobe infiltrate following a pneumonia in December. By CT there was a mixed solid- subsolid mass suspicious for cancer. 2 weeks later on his PET it was felt by Radiology to be more amorphous and less mass-like. The SUV was relatively low.  The differential diagnosis includes a low grade adenocarcinoma as well as benign etiologies, such as resolving pneumonia or BOOP. I discussed 3 options with Mr. and Mrs. Carlo. The options are continued radiographic follow up, bronchscopy and surgical resection. Of those, I would favor bronchoscopy. I am not entirely comfortable with just following radiographically as it has already been 3 months. By the same token proceeding to surgery is probably unnecessary given that it may have improved. I would  be more comfortable following radiographically if we had a negative biopsy to go along with it.  I think with navigational bronchoscopy we can target specific areas of the lesion and this should give us a good idea of what we are dealing with. They do understand that there is a possibility of a false negative biopsy.  I discussed the pro[posed procedure- electromagnetic navigational bronchoscopy with biopsy with the [place]'. They understand we would do this in the OR under general anesthesia. We would expect him to go home the day of the procedure.  They understand the indications, risks, benefits ad alternatives. They understand the risks include those associated with general anesthesia, as well as procedure specific risks such as bleeding, pneumothorax, and failure to make a diagnosis.  He accepts the risks and wishes to proceed.  Plan: ENB in OR on Friday 4/10 

## 2014-03-06 NOTE — Anesthesia Postprocedure Evaluation (Signed)
  Anesthesia Post-op Note  Patient: Robert Garner  Procedure(s) Performed: Procedure(s): VIDEO BRONCHOSCOPY WITH ENDOBRONCHIAL NAVIGATION (N/A)  Patient Location: PACU  Anesthesia Type:General  Level of Consciousness: awake, alert  and oriented  Airway and Oxygen Therapy: Patient Spontanous Breathing and Patient connected to nasal cannula oxygen  Post-op Pain: mild  Post-op Assessment: Post-op Vital signs reviewed  Post-op Vital Signs: stable  Last Vitals:  Filed Vitals:   03/06/14 1038  BP: 140/80  Pulse: 79  Temp:   Resp: 15    Complications: No apparent anesthesia complications

## 2014-03-06 NOTE — Discharge Instructions (Signed)
Do not drive or engage in heavy physical activity for 24 hours, you may resume normal activities tomorrow  You may cough up small amounts of blood over the next few days  My office will contact you with an appointment to discuss the biopsy results  Call [phone] if you develop fever > 101 F, shortness of breath or cough up large amounts of blood  You may use over the counter cough medication if needed

## 2014-03-06 NOTE — Anesthesia Preprocedure Evaluation (Signed)
Anesthesia Evaluation  Patient identified by MRN, date of birth, ID band Patient awake    Reviewed: Allergy & Precautions, H&P , NPO status , Patient's Chart, lab work & pertinent test results  Airway Mallampati: II TM Distance: >3 FB Neck ROM: Full    Dental  (+) Teeth Intact, Dental Advisory Given   Pulmonary Current Smoker,  breath sounds clear to auscultation        Cardiovascular Rhythm:Regular Rate:Normal     Neuro/Psych    GI/Hepatic   Endo/Other    Renal/GU      Musculoskeletal   Abdominal   Peds  Hematology   Anesthesia Other Findings   Reproductive/Obstetrics                           Anesthesia Physical Anesthesia Plan  ASA: III  Anesthesia Plan: General   Post-op Pain Management:    Induction: Intravenous  Airway Management Planned: Oral ETT  Additional Equipment:   Intra-op Plan:   Post-operative Plan: Extubation in OR  Informed Consent: I have reviewed the patients History and Physical, chart, labs and discussed the procedure including the risks, benefits and alternatives for the proposed anesthesia with the patient or authorized representative who has indicated his/her understanding and acceptance.   Dental advisory given  Plan Discussed with: CRNA and Anesthesiologist  Anesthesia Plan Comments: (LUL Infiltrate Smoker/COPD Plan GA with oral ETT  Roberts Gaudy, MD )        Anesthesia Quick Evaluation

## 2014-03-06 NOTE — Progress Notes (Signed)
Patient voiced complaint of having a burning feeling in his chest and described it as acid reflux. Patient and wife both denied patient ever having this symptom before. Patient denied any other symptoms. Nurse called Dr. Albertina Parr and informed him of situation and Dr. Albertina Parr requested that a EKG be performed. Will enter orders and continue to monitor.

## 2014-03-06 NOTE — Interval H&P Note (Signed)
History and Physical Interval Note:  03/06/2014 7:25 AM  Robert Garner  has presented today for surgery, with the diagnosis of LUL infiltrate  The various methods of treatment have been discussed with the patient and family. After consideration of risks, benefits and other options for treatment, the patient has consented to  Procedure(s): [place] (N/A) as a surgical intervention .  The patient's history has been reviewed, patient examined, no change in status, stable for surgery.  I have reviewed the patient's chart and labs.  Questions were answered to the patient's satisfaction.     Melrose Nakayama

## 2014-03-06 NOTE — Op Note (Signed)
NAME:  Robert Garner, Robert Garner NO.:  [id number]  MEDICAL RECORD NO.:  [id number]  LOCATION:  [facility]                         FACILITY:  [facility]  PHYSICIAN:  Revonda Standard. Roxan Hockey, M.D.DATE OF BIRTH:  [birth year]  DATE OF PROCEDURE:  03/06/2014 DATE OF DISCHARGE:  03/06/2014                              OPERATIVE REPORT   PREOPERATIVE DIAGNOSIS:  Left upper lobe mass/infiltrate.  POSTOPERATIVE DIAGNOSIS:  Left upper lobe mass/infiltrate.  PROCEDURE:  Electromagnetic navigational bronchoscopy with brushings, biopsies, needle aspirations, and bronchoalveolar lavage.  SURGEON:  Revonda Standard. Roxan Hockey, MD  ANESTHESIA:  General.  FINDINGS:  Navigated within 1.5 cm of the mass.  Specimens taken from multiple sites within the mass.  Quick prep of initial brushings showed only benign inflammatory changes.  Remaining specimen sent for permanent Pathology and cultures.  OPERATIVE NOTE:  [name] was brought to the operating room on March 06, 2014, there he was anesthetized and intubated.  Flexible fiberoptic bronchoscopy was performed via the endotracheal tube.  It revealed normal endobronchial anatomy and no endobronchial lesions to the level of the subsegmental bronchi.  Next, the locatable guide for the navigational bronchoscopy was placed through the scope.  Mapping of the bronchial tree was performed.  There was good correlation of the virtual bronchoscopy to the video bronchoscopy.  The bronchoscope was directed to the orifice of the left upper lobe bronchus.  The locatable guide then was advanced to the area of the lesion with navigational guidance. The locatable guide was removed when the sheath was within 1.5 cm of the site selected for biopsy.  Samples then were taken through the sheath. Multiple needle brushings were performed followed by needle aspirations. This initial set of specimens was sent for quick prep which showed benign inflammatory changes. In the interim  while awaiting those results, additional brushings were taken.  Biopsies were taken from this site as well.  Next, the second portion of the lesion that had been targeted was sampled after repositioning the sheath and finally the third target within the mass/infiltrate was navigated to and then brushings, biopsies and aspirations were once again performed. Biopsies were sent for both pathology as well as fungal AFB and bacterial cultures.  Bronchoalveolar lavage was performed and sent for both cytology and cultures.    After obtaining all of the samples, the sheath was removed.  A final inspection was made and there was minimal bleeding from the segmental bronchus which cleared with saline irrigation.  The patient was extubated in the operating room and taken to the postanesthetic care unit in good condition.     Revonda Standard Roxan Hockey, M.D.     SCH/MEDQ  D:  03/06/2014  T:  03/06/2014  Job:  [id number]

## 2014-03-06 NOTE — Transfer of Care (Signed)
Immediate Anesthesia Transfer of Care Note  Patient: Robert Garner  Procedure(s) Performed: Procedure(s): VIDEO BRONCHOSCOPY WITH ENDOBRONCHIAL NAVIGATION (N/A)  Patient Location: PACU  Anesthesia Type:General  Level of Consciousness: awake, alert  and oriented  Airway & Oxygen Therapy: Patient Spontanous Breathing and Patient connected to nasal cannula oxygen  Post-op Assessment: Report given to PACU RN and Post -op Vital signs reviewed and stable  Post vital signs: Reviewed and stable  Complications: No apparent anesthesia complications

## 2014-03-06 NOTE — Anesthesia Procedure Notes (Signed)
Procedure Name: Intubation Date/Time: 03/06/2014 7:57 AM Performed by: [place] Pink Pre-anesthesia Checklist: Patient identified, Timeout performed, Emergency Drugs available, Suction available and Patient being monitored Patient Re-evaluated:Patient Re-evaluated prior to inductionOxygen Delivery Method: Circle system utilized Preoxygenation: Pre-oxygenation with 100% oxygen Intubation Type: IV induction Ventilation: Two handed mask ventilation required and Oral airway inserted - appropriate to patient size Laryngoscope Size: Mac and 3 Grade View: Grade II Tube type: Oral Tube size: 8.5 mm Number of attempts: 1 Airway Equipment and Method: Stylet and LTA kit utilized Placement Confirmation: ETT inserted through vocal cords under direct vision,  positive ETCO2 and breath sounds checked- equal and bilateral Secured at: 23 cm Tube secured with: Tape Dental Injury: Teeth and Oropharynx as per pre-operative assessment

## 2014-03-08 LAB — CULTURE, RESPIRATORY
CULTURE: NO GROWTH
GRAM STAIN: NONE SEEN

## 2014-03-08 LAB — CULTURE, RESPIRATORY W GRAM STAIN

## 2014-03-09 ENCOUNTER — Encounter (HOSPITAL_COMMUNITY): Payer: Self-pay | Admitting: Thoracic Surgery (Cardiothoracic Vascular Surgery)

## 2014-03-09 LAB — TISSUE CULTURE
CULTURE: NO GROWTH
GRAM STAIN: NONE SEEN

## 2014-03-12 ENCOUNTER — Ambulatory Visit: Payer: BC Managed Care – PPO | Admitting: Thoracic Surgery (Cardiothoracic Vascular Surgery)

## 2014-03-13 ENCOUNTER — Ambulatory Visit (INDEPENDENT_AMBULATORY_CARE_PROVIDER_SITE_OTHER): Payer: BC Managed Care – PPO | Admitting: Thoracic Surgery (Cardiothoracic Vascular Surgery)

## 2014-03-13 ENCOUNTER — Encounter: Payer: Self-pay | Admitting: Thoracic Surgery (Cardiothoracic Vascular Surgery)

## 2014-03-13 VITALS — BP 144/87 | HR 76 | Resp 16 | Ht 68.0 in | Wt 193.0 lb

## 2014-03-13 DIAGNOSIS — R918 Other nonspecific abnormal finding of lung field: Secondary | ICD-10-CM

## 2014-03-13 NOTE — Progress Notes (Signed)
Patient ID: Robert Garner, male   DOB: [birth year], 63 y.o.   MRN: [id number]   Robert Garner returns today to discuss results from his navigational bronchoscopy last week.  He is a 63 year old gentleman who presented with fever, chills and productive cough in December. A CXR showed a left lower lobe infiltrate. He was treated with antibiotics and his symptoms resolved.  A follow up CXR showed a persistent infiltrate so a CT was done on 02/04/2014. It showed a 5 cm left upper lobe mass with solid and sub-solid components, suspicious for cancer.   This was followed by a PET/CT which showed the lesion to be more amorphous and less mass-like. The SUV was only 2.3 and it was suggested this may in fact be post infectious.  I did a navigational bronchoscopy on him on April 10. He tolerated this procedure well and went home the same day.  The biopsies brushings and washings all returned with no evidence of tumor seen.  Stains were negative. Viral, fungal, and AFB cultures are still in progress.  I reviewed the results with Mr. and Mrs. Robert Garner. They understand that these biopsies do not clinically rule out the possibility of cancer, even though they did make it less likely. There is always a possibility of a false negative with bronchoscopic biopsies and brushings. Therefore we do need to follow this process to its completion.\  Given the findings at bronchoscopy as well as the findings on PET CT, think the most reasonable course of action would be to repeat a CT in about 6 weeks. That would be roughly 8 weeks since his PET/CT. It appears that the process is resolving, we will follow up to complete resolution. If there is any sign of progression then we may need to consider surgical resection despite the negative biopsies.  PLAN- return in 6 weeks with CT of chest

## 2014-03-16 LAB — VIRAL CULTURE VIRC

## 2014-03-17 ENCOUNTER — Other Ambulatory Visit: Payer: Self-pay | Admitting: *Deleted

## 2014-03-17 DIAGNOSIS — R918 Other nonspecific abnormal finding of lung field: Secondary | ICD-10-CM

## 2014-04-02 LAB — FUNGUS CULTURE W SMEAR
Fungal Smear: NONE SEEN
Fungal Smear: NONE SEEN

## 2014-04-03 ENCOUNTER — Other Ambulatory Visit: Payer: Self-pay | Admitting: Gastroenterology

## 2014-04-19 LAB — AFB CULTURE WITH SMEAR (NOT AT ARMC)
Acid Fast Smear: NONE SEEN
Acid Fast Smear: NONE SEEN

## 2014-04-24 ENCOUNTER — Ambulatory Visit
Admission: RE | Admit: 2014-04-24 | Discharge: 2014-04-24 | Disposition: A | Discharge status: Home / Self Care | Payer: BC Managed Care – PPO | Source: Ambulatory Visit | Attending: Thoracic Surgery (Cardiothoracic Vascular Surgery) | Admitting: Thoracic Surgery (Cardiothoracic Vascular Surgery)

## 2014-04-24 DIAGNOSIS — R918 Other nonspecific abnormal finding of lung field: Secondary | ICD-10-CM

## 2014-04-28 ENCOUNTER — Encounter: Payer: Self-pay | Admitting: Thoracic Surgery (Cardiothoracic Vascular Surgery)

## 2014-04-28 ENCOUNTER — Ambulatory Visit (INDEPENDENT_AMBULATORY_CARE_PROVIDER_SITE_OTHER): Payer: BC Managed Care – PPO | Admitting: Thoracic Surgery (Cardiothoracic Vascular Surgery)

## 2014-04-28 VITALS — BP 139/89 | HR 88 | Resp 20 | Ht 68.0 in | Wt 193.0 lb

## 2014-04-28 DIAGNOSIS — R918 Other nonspecific abnormal finding of lung field: Secondary | ICD-10-CM

## 2014-04-28 NOTE — Progress Notes (Signed)
HPI:  Mr. Thorstenson returns today for a 6 week followup.  He is a 63 year old smoker who presented with fever, chills and productive cough in December. A CXR showed a left lower lobe infiltrate. He was treated with antibiotics and his symptoms resolved.   A follow up CXR showed a persistent infiltrate so a CT was done on 02/04/2014. It showed a 5 cm left upper lobe mass with solid and sub-solid components, suspicious for cancer.   This was followed by a PET/CT which showed the lesion to be more amorphous and less mass-like. The SUV was only 2.3 and it was suggested this may in fact be post infectious.   I did a navigational bronchoscopy on him on April 10. He tolerated this procedure well and went home the same day.   The biopsies brushings and washings all returned with no evidence of tumor seen. Viral, fungal, and AFB cultures were all negative.  As the possibility of a false negative biopsy I recommended that we repeat another CT scan at this time (approximately 8 weeks after his PET/CT).  In the interim since last visit he continues to smoke. He has not had any difficulty with his breathing. He has not had any unusual cough or hemoptysis. His weight has been stable. He denies any fevers, chills, or sweats.  Past Medical History  Diagnosis Date  . COPD (chronic obstructive pulmonary disease)   . Keratitis sicca, right eye   . Seasonal allergies   . Pneumonia 10/2013  . Anxiety      Current Outpatient Prescriptions  Medication Sig Dispense Refill  . ALREX 0.2 % SUSP       . aspirin 81 MG EC tablet Take 81 mg by mouth daily. Swallow whole.      Marland Kitchen BIOTIN PO Take 40 mg by mouth daily.      . Calcium-Vitamin D (CALTRATE 600 PLUS-VIT D PO) Take by mouth daily.      . Cranberry 450 MG TABS Take 900 mg by mouth daily.      . montelukast (SINGULAIR) 10 MG tablet Take 10 mg by mouth daily as needed (allergies).       . Multiple Vitamin (MULTIVITAMIN) capsule Take 1 capsule by mouth daily.       . Omega-3 Fatty Acids (FISH OIL PO) Take 2 capsules by mouth 2 (two) times daily.      Marland Kitchen OVER THE COUNTER MEDICATION Take 1 tablet by mouth daily. Super B Energy Complex vitamin      . polyethylene glycol-electrolytes (NULYTELY/GOLYTELY) 420 G solution       . Probiotic Product (SOLUBLE FIBER/PROBIOTICS PO) Take 1 capsule by mouth daily.      . sertraline (ZOLOFT) 50 MG tablet Take 50 mg by mouth daily.       . valACYclovir (VALTREX) 1000 MG tablet Take 1,000 mg by mouth daily.      . vitamin E 400 UNIT capsule Take 400 Units by mouth daily.       No current facility-administered medications for this visit.    Physical Exam BP 139/89  Pulse 88  Resp 20  Ht 5\' 8"  (1.727 m)  Wt 193 lb (87.544 kg)  BMI 29.35 kg/m2  SpO2 96% Well-appearing 63 year old male in no acute distress Lungs clear with equal breath sounds bilaterally Cardiac regular rate and rhythm normal S1 and S2 No palpable adenopathy  Diagnostic Tests: CT chest 04/24/2014 CT CHEST WITHOUT CONTRAST  TECHNIQUE:  Multidetector CT imaging of the chest was performed  following the  standard protocol without IV contrast.  COMPARISON: PET-CT dated 02/16/2014. CT chest dated 02/04/2014.  FINDINGS:  Near-complete resolution of prior left upper lobe/lingular mass-like  opacity, likely post infectious/inflammatory, now with mild residual  platelike scarring.  Underlying 8 x 10 mm nodular opacity in the lingula (series 4/ image  41), not previously visualized, favored to reflect nodular scarring.  No new/suspicious pulmonary nodules.  Moderate centrilobular and paraseptal emphysematous changes. No  pleural effusion or pneumothorax.  Visualized thyroid is unremarkable.  Heart is normal in size. No pericardial effusion. Coronary  atherosclerosis. Atherosclerotic calcifications of the aortic arch.  Ectasia of the ascending thoracic aorta measuring up to 3.5 cm.  Small mediastinal lymph nodes which do not meet pathologic CT  size  criteria.  Visualized upper abdomen is notable for mild to moderate hepatic  steatosis.  Degenerative changes of the visualized thoracolumbar spine.  IMPRESSION:  Near-complete resolution of prior left upper lobe/lingular mass like  opacity, likely post infectious/inflammatory, now with residual  scarring.  Underlying 8 x 10 mm nodular opacity in the lingula, new, favored to  reflect nodular scarring. Consider additional follow-up CT chest in  3 months to assess for resolution.  Moderate emphysematous changes.  Electronically Signed  By: Julian Hy M.D.  On: 04/24/2014 16:50   Impression: 63 year old smoker with a complex inflammatory/infectious process in the left lung. When first seen on CT, this was a 5.5 centimeter mixed solid and sub-solid density. On PET CT several weeks later there was less of a solid component, but it was hypermetabolic. After discussion he wanted to go ahead and have it biopsied. Biopsies were negative for cancer. Cultures were negative as well.  He now returns with an interval CT scan. The processes for the most part resolved and the overall appearance is much improved. However there is a 9.6 x 8 mm rounded "nodule" in the left upper lobe. The radiologist feels that this likely is inflammatory in nature rather than malignant. However, I do think we need to repeat his CT in about 3 months to make sure that this lesion is not growing. Although unlikely, it remains possible that this lesion has been present the whole time and was hidden by the inflammatory process.  Plan: Return in 3 months with CT of chest

## 2014-07-07 ENCOUNTER — Other Ambulatory Visit: Payer: Self-pay | Admitting: *Deleted

## 2014-07-07 DIAGNOSIS — R911 Solitary pulmonary nodule: Secondary | ICD-10-CM

## 2014-08-04 ENCOUNTER — Ambulatory Visit
Admission: RE | Admit: 2014-08-04 | Discharge: 2014-08-04 | Disposition: A | Discharge status: Home / Self Care | Payer: BC Managed Care – PPO | Source: Ambulatory Visit | Attending: Thoracic Surgery (Cardiothoracic Vascular Surgery) | Admitting: Thoracic Surgery (Cardiothoracic Vascular Surgery)

## 2014-08-04 ENCOUNTER — Encounter: Payer: Self-pay | Admitting: Thoracic Surgery (Cardiothoracic Vascular Surgery)

## 2014-08-04 ENCOUNTER — Ambulatory Visit (INDEPENDENT_AMBULATORY_CARE_PROVIDER_SITE_OTHER): Payer: BC Managed Care – PPO | Admitting: Thoracic Surgery (Cardiothoracic Vascular Surgery)

## 2014-08-04 VITALS — BP 126/82 | HR 77 | Resp 16 | Ht 68.0 in | Wt 193.0 lb

## 2014-08-04 DIAGNOSIS — R911 Solitary pulmonary nodule: Secondary | ICD-10-CM

## 2014-08-04 DIAGNOSIS — R918 Other nonspecific abnormal finding of lung field: Secondary | ICD-10-CM

## 2014-08-04 NOTE — Progress Notes (Signed)
HPI: [name] returns today for a scheduled 3 month followup.   He is a 63 year old smoker who presented with fever, chills and productive cough in December. A CXR showed a left lower lobe infiltrate. He was treated with antibiotics and his symptoms resolved.  A follow up CXR showed a persistent infiltrate so a CT was done on 02/04/2014. It showed a 5 cm left upper lobe mass with solid and sub-solid components, suspicious for cancer.   This was followed by a PET/CT which showed the lesion to be more amorphous and less mass-like. The SUV was only 2.3 and it was suggested this may, in fact, be post infectious.   I did a navigational bronchoscopy on him on April 10. He tolerated this procedure well and went home the same day. The biopsies, brushings and washings all returned with no evidence of tumor seen. Viral, fungal, and AFB cultures were all negative.   Given the possibility of a false negative biopsy, I recommended that we repeat another CT scan at 8 weeks. I saw him in June and there was continued resolution, but there remained the possibility of a ~ 1 cm LUL nodule.   In the interim since last visit he continues to smoke. He says he is down to 1/2 to 3/4 of a ppd. He has not had any difficulty with his breathing. He has not had any unusual cough or hemoptysis. His weight has been stable. He denies any fevers, chills, or sweats.   Past Medical History  Diagnosis Date  . COPD (chronic obstructive pulmonary disease)   . Keratitis sicca, right eye   . Seasonal allergies   . Pneumonia 10/2013  . Anxiety     Current Outpatient Prescriptions  Medication Sig Dispense Refill  . ALREX 0.2 % SUSP       . aspirin 81 MG EC tablet Take 81 mg by mouth daily. Swallow whole.      Marland Kitchen BIOTIN PO Take 40 mg by mouth daily.      . Calcium-Vitamin D (CALTRATE 600 PLUS-VIT D PO) Take by mouth daily.      . Cranberry 450 MG TABS Take 900 mg by mouth daily.      . montelukast (SINGULAIR) 10 MG tablet Take  10 mg by mouth daily as needed (allergies).       . Multiple Vitamin (MULTIVITAMIN) capsule Take 1 capsule by mouth daily.      . Omega-3 Fatty Acids (FISH OIL PO) Take 2 capsules by mouth 2 (two) times daily.      Marland Kitchen OVER THE COUNTER MEDICATION Take 1 tablet by mouth daily. Super B Energy Complex vitamin      . polyethylene glycol-electrolytes (NULYTELY/GOLYTELY) 420 G solution       . Probiotic Product (SOLUBLE FIBER/PROBIOTICS PO) Take 1 capsule by mouth daily.      . sertraline (ZOLOFT) 50 MG tablet Take 50 mg by mouth daily.       . valACYclovir (VALTREX) 1000 MG tablet Take 1,000 mg by mouth daily.      . vitamin E 400 UNIT capsule Take 400 Units by mouth daily.       No current facility-administered medications for this visit.    Physical Exam BP 126/82  Pulse 77  Resp 16  Ht 5\' 8"  (1.727 m)  Wt 193 lb (87.544 kg)  BMI 29.35 kg/m2  SpO37 50% 63 year old man in no acute distress Alert and oriented x3 no focal neurologic deficits No cervical or subclavicular adenopathy  Lungs decreased breath sounds bilaterally, no wheezing Cardiac regular rate and rhythm  Diagnostic Tests: CT chest 08/04/2014 CT CHEST WITHOUT CONTRAST  TECHNIQUE:  Multidetector CT imaging of the chest was performed following the  standard protocol without IV contrast.  COMPARISON: CT 04/24/2014, PET-CT 02/16/2014  FINDINGS:  There is some mild residual linear scarring within the left upper  lobe. No measurable nodularity remains.  Centrilobular emphysema noted in the upper lobes. No new pulmonary  nodules. No axillary or supraclavicular lymphadenopathy. No  mediastinal or hilar lymphadenopathy. No pericardial fluid.  Esophagus is normal.  Limited upper abdomen is unremarkable. The skeleton is unremarkable.  IMPRESSION:  1. No measurable nodularity in the left upper lobe. Mild linear  thickening remains. Findings are consistent with resolution of the  inflammatory / infectious process.  2. No  mediastinal adenopathy.  3. Centrilobular emphysema noted.  Electronically Signed  By: Suzy Bouchard M.D.  On: 08/04/2014 09:49  Impression: [name] is a 63 year old smoker who had a pneumonia last December. He had a residual infiltrate for a long period of time. This is been gradually resolving over time. At bronchoscopy no evidence of malignancy was found. We have followed him with CT to make sure there was no underlying lung mass.  On his CT today there is no nodularity. There is continued resolution and scarring.  Result pneumonia. No lung mass this time.  [name] does fit the criteria for annual low dose CT scanning-63 years old, currently smoking, greater than 30-pack-year history of smoking. I went ahead and set that up for him. He will discuss with Dr. Harrington Challenger at his next appointment as well.  He was strongly encouraged to quit smoking altogether. He understands the importance, but seems unlikely to quit.  Plan:  Return in one year with low dose CT for annual lung cancer screening

## 2015-07-26 ENCOUNTER — Other Ambulatory Visit: Payer: Self-pay | Admitting: Thoracic Surgery (Cardiothoracic Vascular Surgery)

## 2015-07-26 DIAGNOSIS — R918 Other nonspecific abnormal finding of lung field: Secondary | ICD-10-CM

## 2015-08-10 ENCOUNTER — Ambulatory Visit: Payer: Self-pay | Admitting: Thoracic Surgery (Cardiothoracic Vascular Surgery)

## 2015-08-10 ENCOUNTER — Other Ambulatory Visit: Payer: Self-pay

## 2016-04-18 DIAGNOSIS — S6992XA Unspecified injury of left wrist, hand and finger(s), initial encounter: Secondary | ICD-10-CM | POA: Insufficient documentation

## 2016-04-18 DIAGNOSIS — S63591A Other specified sprain of right wrist, initial encounter: Secondary | ICD-10-CM | POA: Insufficient documentation

## 2016-04-26 ENCOUNTER — Other Ambulatory Visit: Payer: Self-pay | Admitting: Orthopedic Surgery

## 2016-04-27 ENCOUNTER — Encounter (HOSPITAL_BASED_OUTPATIENT_CLINIC_OR_DEPARTMENT_OTHER): Payer: Self-pay | Admitting: *Deleted

## 2016-05-04 ENCOUNTER — Encounter (HOSPITAL_BASED_OUTPATIENT_CLINIC_OR_DEPARTMENT_OTHER)
Admission: RE | Disposition: A | Discharge status: Home / Self Care | Payer: Self-pay | Source: Ambulatory Visit | Attending: Orthopedic Surgery

## 2016-05-04 ENCOUNTER — Ambulatory Visit (HOSPITAL_BASED_OUTPATIENT_CLINIC_OR_DEPARTMENT_OTHER): Payer: Worker's Compensation | Admitting: Anesthesiology

## 2016-05-04 ENCOUNTER — Encounter (HOSPITAL_BASED_OUTPATIENT_CLINIC_OR_DEPARTMENT_OTHER): Payer: Self-pay | Admitting: Anesthesiology

## 2016-05-04 ENCOUNTER — Ambulatory Visit (HOSPITAL_BASED_OUTPATIENT_CLINIC_OR_DEPARTMENT_OTHER)
Admission: RE | Admit: 2016-05-04 | Discharge: 2016-05-04 | Disposition: A | Discharge status: Home / Self Care | Payer: Worker's Compensation | Source: Ambulatory Visit | Attending: Orthopedic Surgery | Admitting: Orthopedic Surgery

## 2016-05-04 DIAGNOSIS — X58XXXA Exposure to other specified factors, initial encounter: Secondary | ICD-10-CM | POA: Diagnosis not present

## 2016-05-04 DIAGNOSIS — S63591A Other specified sprain of right wrist, initial encounter: Secondary | ICD-10-CM | POA: Insufficient documentation

## 2016-05-04 DIAGNOSIS — F419 Anxiety disorder, unspecified: Secondary | ICD-10-CM | POA: Diagnosis not present

## 2016-05-04 DIAGNOSIS — Z7982 Long term (current) use of aspirin: Secondary | ICD-10-CM | POA: Diagnosis not present

## 2016-05-04 DIAGNOSIS — F17210 Nicotine dependence, cigarettes, uncomplicated: Secondary | ICD-10-CM | POA: Insufficient documentation

## 2016-05-04 HISTORY — PX: STERIOD INJECTION: SHX5046

## 2016-05-04 HISTORY — PX: WRIST ARTHROSCOPY WITH DEBRIDEMENT: SHX6194

## 2016-05-04 SURGERY — WRIST ARTHROSCOPY WITH DEBRIDEMENT
Anesthesia: General | Site: Wrist | Laterality: Right

## 2016-05-04 MED ORDER — FENTANYL CITRATE (PF) 100 MCG/2ML IJ SOLN
INTRAMUSCULAR | Status: AC
  Filled 2016-05-04: qty 2

## 2016-05-04 MED ORDER — BETAMETHASONE SOD PHOS & ACET 6 (3-3) MG/ML IJ SUSP
INTRAMUSCULAR | Status: DC | PRN
  Administered 2016-05-04: .5 mL via INTRA_ARTICULAR

## 2016-05-04 MED ORDER — ONDANSETRON HCL 4 MG/2ML IJ SOLN
INTRAMUSCULAR | Status: AC
  Filled 2016-05-04: qty 2

## 2016-05-04 MED ORDER — BETAMETHASONE SOD PHOS & ACET 6 (3-3) MG/ML IJ SUSP
INTRAMUSCULAR | Status: AC
  Filled 2016-05-04: qty 1

## 2016-05-04 MED ORDER — MIDAZOLAM HCL 2 MG/2ML IJ SOLN
1.0000 mg | INTRAMUSCULAR | Status: DC | PRN
  Administered 2016-05-04: 2 mg via INTRAVENOUS

## 2016-05-04 MED ORDER — LIDOCAINE HCL 1 % IJ SOLN
INTRAMUSCULAR | Status: DC | PRN
  Administered 2016-05-04: .5 mL

## 2016-05-04 MED ORDER — DEXAMETHASONE SODIUM PHOSPHATE 10 MG/ML IJ SOLN
INTRAMUSCULAR | Status: AC
  Filled 2016-05-04: qty 1

## 2016-05-04 MED ORDER — SODIUM CHLORIDE 0.9 % IJ SOLN
INTRAMUSCULAR | Status: AC
  Filled 2016-05-04: qty 10

## 2016-05-04 MED ORDER — HYDROMORPHONE HCL 1 MG/ML IJ SOLN: 0.2500 mg | INTRAMUSCULAR | Status: DC | PRN

## 2016-05-04 MED ORDER — LIDOCAINE 2% (20 MG/ML) 5 ML SYRINGE
INTRAMUSCULAR | Status: AC
  Filled 2016-05-04: qty 5

## 2016-05-04 MED ORDER — CEFAZOLIN SODIUM-DEXTROSE 2-4 GM/100ML-% IV SOLN
INTRAVENOUS | Status: AC
  Filled 2016-05-04: qty 100

## 2016-05-04 MED ORDER — MIDAZOLAM HCL 2 MG/2ML IJ SOLN
INTRAMUSCULAR | Status: AC
  Filled 2016-05-04: qty 2

## 2016-05-04 MED ORDER — CEFAZOLIN SODIUM 1 G IJ SOLR
INTRAMUSCULAR | Status: AC
  Filled 2016-05-04: qty 20

## 2016-05-04 MED ORDER — ONDANSETRON HCL 4 MG/2ML IJ SOLN
INTRAMUSCULAR | Status: DC | PRN
  Administered 2016-05-04: 4 mg via INTRAVENOUS

## 2016-05-04 MED ORDER — PROPOFOL 10 MG/ML IV BOLUS
INTRAVENOUS | Status: DC | PRN
  Administered 2016-05-04: 200 mg via INTRAVENOUS

## 2016-05-04 MED ORDER — DEXAMETHASONE SODIUM PHOSPHATE 4 MG/ML IJ SOLN
INTRAMUSCULAR | Status: DC | PRN
  Administered 2016-05-04: 10 mg via INTRAVENOUS

## 2016-05-04 MED ORDER — TRAMADOL HCL 50 MG PO TABS: ORAL_TABLET | ORAL | Status: DC

## 2016-05-04 MED ORDER — LACTATED RINGERS IV SOLN
INTRAVENOUS | Status: DC
  Administered 2016-05-04 (×2): via INTRAVENOUS

## 2016-05-04 MED ORDER — FENTANYL CITRATE (PF) 100 MCG/2ML IJ SOLN
INTRAMUSCULAR | Status: DC | PRN
  Administered 2016-05-04: 100 ug via INTRAVENOUS

## 2016-05-04 MED ORDER — HYDROCODONE-ACETAMINOPHEN 5-325 MG PO TABS: ORAL_TABLET | ORAL | Status: DC

## 2016-05-04 MED ORDER — PROMETHAZINE HCL 25 MG/ML IJ SOLN: 6.2500 mg | INTRAMUSCULAR | Status: DC | PRN

## 2016-05-04 MED ORDER — SODIUM CHLORIDE 0.9 % IV SOLN
INTRAVENOUS | Status: DC | PRN
  Administered 2016-05-04: 1500 mL

## 2016-05-04 MED ORDER — SCOPOLAMINE 1 MG/3DAYS TD PT72: 1.0000 | MEDICATED_PATCH | Freq: Once | TRANSDERMAL | Status: DC | PRN

## 2016-05-04 MED ORDER — BUPIVACAINE-EPINEPHRINE (PF) 0.5% -1:200000 IJ SOLN
INTRAMUSCULAR | Status: DC | PRN
  Administered 2016-05-04: 30 mL via PERINEURAL

## 2016-05-04 MED ORDER — CHLORHEXIDINE GLUCONATE 4 % EX LIQD: 60.0000 mL | Freq: Once | CUTANEOUS | Status: DC

## 2016-05-04 MED ORDER — FENTANYL CITRATE (PF) 100 MCG/2ML IJ SOLN
50.0000 ug | INTRAMUSCULAR | Status: DC | PRN
  Administered 2016-05-04: 100 ug via INTRAVENOUS

## 2016-05-04 MED ORDER — CEFAZOLIN SODIUM-DEXTROSE 2-4 GM/100ML-% IV SOLN
2.0000 g | INTRAVENOUS | Status: AC
Start: 2016-05-04 — End: 2016-05-04
  Administered 2016-05-04: 2 g via INTRAVENOUS

## 2016-05-04 MED ORDER — LIDOCAINE HCL (PF) 1 % IJ SOLN
INTRAMUSCULAR | Status: AC
  Filled 2016-05-04: qty 5

## 2016-05-04 MED ORDER — GLYCOPYRROLATE 0.2 MG/ML IJ SOLN: 0.2000 mg | Freq: Once | INTRAMUSCULAR | Status: DC | PRN

## 2016-05-04 MED ORDER — LIDOCAINE HCL (CARDIAC) 20 MG/ML IV SOLN
INTRAVENOUS | Status: DC | PRN
  Administered 2016-05-04: 30 mg via INTRAVENOUS

## 2016-05-04 MED ORDER — MIDAZOLAM HCL 5 MG/5ML IJ SOLN
INTRAMUSCULAR | Status: DC | PRN
  Administered 2016-05-04: 2 mg via INTRAVENOUS

## 2016-05-04 SURGICAL SUPPLY — 91 items
BANDAGE ACE 3X5.8 VEL STRL LF (GAUZE/BANDAGES/DRESSINGS) ×2 IMPLANT
BANDAGE ACE 4X5 VEL STRL LF (GAUZE/BANDAGES/DRESSINGS) ×5 IMPLANT
BLADE CUDA 2.0 (BLADE) IMPLANT
BLADE EAR TYMPAN 2.5 60D BEAV (BLADE) IMPLANT
BLADE MINI RND TIP GREEN BEAV (BLADE) IMPLANT
BLADE SURG 15 STRL LF DISP TIS (BLADE) ×5 IMPLANT
BLADE SURG 15 STRL SS (BLADE) ×5
BNDG CMPR 9X4 STRL LF SNTH (GAUZE/BANDAGES/DRESSINGS) ×3
BNDG ESMARK 4X9 LF (GAUZE/BANDAGES/DRESSINGS) ×4 IMPLANT
BNDG GAUZE ELAST 4 BULKY (GAUZE/BANDAGES/DRESSINGS) ×5 IMPLANT
BUR CUDA 2.9 (BURR) IMPLANT
BUR CUDA 2.9MM (BURR)
BUR FULL RADIUS 2.0 (BURR) IMPLANT
BUR FULL RADIUS 2.0MM (BURR)
BUR FULL RADIUS 2.9 (BURR) ×3 IMPLANT
BUR FULL RADIUS 2.9MM (BURR) ×1
BUR GATOR 2.9 (BURR) IMPLANT
BUR GATOR 2.9MM (BURR)
BUR SPHERICAL 2.9 (BURR) IMPLANT
BUR SPHERICAL 2.9MM (BURR)
CANISTER SUCT 1200ML W/VALVE (MISCELLANEOUS) ×3 IMPLANT
CHLORAPREP W/TINT 26ML (MISCELLANEOUS) ×5 IMPLANT
CLOSURE WOUND 1/2 X4 (GAUZE/BANDAGES/DRESSINGS)
CLOSURE WOUND 1/4X4 (GAUZE/BANDAGES/DRESSINGS)
CORDS BIPOLAR (ELECTRODE) IMPLANT
COVER BACK TABLE 60X90IN (DRAPES) ×5 IMPLANT
COVER MAYO STAND STRL (DRAPES) ×4 IMPLANT
CUFF TOURNIQUET SINGLE 18IN (TOURNIQUET CUFF) ×5 IMPLANT
DRAPE EXTREMITY T 121X128X90 (DRAPE) ×5 IMPLANT
DRAPE IMP U-DRAPE 54X76 (DRAPES) ×7 IMPLANT
DRAPE OEC MINIVIEW 54X84 (DRAPES) IMPLANT
DRAPE SURG 17X23 STRL (DRAPES) ×5 IMPLANT
ELECT SMALL JOINT 90D BASC (ELECTRODE) IMPLANT
GAUZE SPONGE 4X4 12PLY STRL (GAUZE/BANDAGES/DRESSINGS) ×5 IMPLANT
GAUZE XEROFORM 1X8 LF (GAUZE/BANDAGES/DRESSINGS) ×5 IMPLANT
GLOVE BIO SURGEON STRL SZ7.5 (GLOVE) ×5 IMPLANT
GLOVE BIOGEL PI IND STRL 7.0 (GLOVE) ×4 IMPLANT
GLOVE BIOGEL PI IND STRL 8.5 (GLOVE) ×3 IMPLANT
GLOVE BIOGEL PI INDICATOR 7.0 (GLOVE) ×4
GLOVE BIOGEL PI INDICATOR 8.5 (GLOVE) ×2
GLOVE ECLIPSE 6.5 STRL STRAW (GLOVE) ×3 IMPLANT
GLOVE SURG ORTHO 8.0 STRL STRW (GLOVE) IMPLANT
GLOVE SURG SS PI 8.0 STRL IVOR (GLOVE) ×3 IMPLANT
GOWN STRL REUS W/ TWL LRG LVL3 (GOWN DISPOSABLE) ×3 IMPLANT
GOWN STRL REUS W/TWL LRG LVL3 (GOWN DISPOSABLE) ×5
GOWN STRL REUS W/TWL XL LVL3 (GOWN DISPOSABLE) ×9 IMPLANT
IV SET EXTENSION GRAVITY 40 LF (IV SETS) ×5 IMPLANT
MANIFOLD NEPTUNE II (INSTRUMENTS) IMPLANT
NDL EPIDURAL TUOHY 20GX3.5 (NEEDLE) IMPLANT
NDL SAFETY ECLIPSE 18X1.5 (NEEDLE) ×3 IMPLANT
NDL SPNL 18GX3.5 QUINCKE PK (NEEDLE) IMPLANT
NEEDLE HYPO 18GX1.5 SHARP (NEEDLE) ×5
NEEDLE HYPO 22GX1.5 SAFETY (NEEDLE) ×5 IMPLANT
NEEDLE SPNL 18GX3.5 QUINCKE PK (NEEDLE) IMPLANT
NEEDLE TUOHY 20GX3.5 (NEEDLE) IMPLANT
PACK BASIN DAY SURGERY FS (CUSTOM PROCEDURE TRAY) ×5 IMPLANT
PAD CAST 3X4 CTTN HI CHSV (CAST SUPPLIES) ×3 IMPLANT
PADDING CAST ABS 3INX4YD NS (CAST SUPPLIES) ×2
PADDING CAST ABS 4INX4YD NS (CAST SUPPLIES) ×2
PADDING CAST ABS COTTON 3X4 (CAST SUPPLIES) ×3 IMPLANT
PADDING CAST ABS COTTON 4X4 ST (CAST SUPPLIES) ×3 IMPLANT
PADDING CAST COTTON 3X4 STRL (CAST SUPPLIES) ×5
ROUTER HOODED VORTEX 2.9MM (BLADE) IMPLANT
SET ARTHROSCOPY TUBING (MISCELLANEOUS) ×5
SET ARTHROSCOPY TUBING LN (MISCELLANEOUS) ×1 IMPLANT
SET SM JOINT TUBING/CANN (CANNULA) IMPLANT
SLEEVE SCD COMPRESS KNEE MED (MISCELLANEOUS) ×5 IMPLANT
SLING ARM FOAM STRAP LRG (SOFTGOODS) ×3 IMPLANT
SPLINT PLASTER CAST XFAST 3X15 (CAST SUPPLIES) ×90 IMPLANT
SPLINT PLASTER XTRA FASTSET 3X (CAST SUPPLIES) ×60
STOCKINETTE 4X48 STRL (DRAPES) ×5 IMPLANT
STRIP CLOSURE SKIN 1/2X4 (GAUZE/BANDAGES/DRESSINGS) IMPLANT
STRIP CLOSURE SKIN 1/4X4 (GAUZE/BANDAGES/DRESSINGS) IMPLANT
SUCTION FRAZIER HANDLE 10FR (MISCELLANEOUS)
SUCTION TUBE FRAZIER 10FR DISP (MISCELLANEOUS) IMPLANT
SUT ETHILON 4 0 PS 2 18 (SUTURE) IMPLANT
SUT ETHILON 5 0 P 3 18 (SUTURE)
SUT MERSILENE 4 0 P 3 (SUTURE) IMPLANT
SUT NYLON ETHILON 5-0 P-3 1X18 (SUTURE) IMPLANT
SUT PDS AB 2-0 CT2 27 (SUTURE) IMPLANT
SUT STEEL 4 0 (SUTURE) IMPLANT
SUT VIC AB 2-0 PS2 27 (SUTURE) IMPLANT
SUT VICRYL 4-0 PS2 18IN ABS (SUTURE) IMPLANT
SYR 3ML 18GX1 1/2 (SYRINGE) ×5 IMPLANT
SYR BULB 3OZ (MISCELLANEOUS) ×5 IMPLANT
SYR CONTROL 10ML LL (SYRINGE) ×5 IMPLANT
TUBE CONNECTING 20'X1/4 (TUBING)
TUBE CONNECTING 20X1/4 (TUBING) IMPLANT
UNDERPAD 30X30 (UNDERPADS AND DIAPERS) ×2 IMPLANT
WAND 1.5 MICROBLATOR (SURGICAL WAND) IMPLANT
WATER STERILE IRR 1000ML POUR (IV SOLUTION) ×5 IMPLANT

## 2016-05-04 NOTE — Progress Notes (Signed)
Assisted Dr. Singer with right, ultrasound guided, supraclavicular block. Side rails up, monitors on throughout procedure. See vital signs in flow sheet. Tolerated Procedure well. 

## 2016-05-04 NOTE — Anesthesia Procedure Notes (Addendum)
Anesthesia Regional Block:  Supraclavicular block  Pre-Anesthetic Checklist: ,, timeout performed, Correct Patient, Correct Site, Correct Laterality, Correct Procedure,, site marked, risks and benefits discussed, Surgical consent,  Pre-op evaluation,  At surgeon's request and post-op pain management  Laterality: Right  Prep: chloraprep       Needles:  Injection technique: Single-shot  Needle Type: Echogenic Stimulator Needle     Needle Length: 5cm 5 cm Needle Gauge: 22 and 22 G    Additional Needles:  Procedures: ultrasound guided (picture in chart) and nerve stimulator Supraclavicular block  Nerve Stimulator or Paresthesia:  Response: bicep contraction, 0.48 mA,   Additional Responses:   Narrative:  Start time: 05/04/2016 11:19 AM End time: 05/04/2016 11:29 AM Injection made incrementally with aspirations every 5 mL.  Performed by: Personally   Additional Notes: Functioning IV was confirmed and monitors applied.  A 15mm 22ga echogenic arrow stimulator was used. Sterile prep and drape,hand hygiene and sterile gloves were used.Ultrasound guidance: relevent anatomy identified, needle position confirmed, local anesthetic spread visualized around nerve(s)., vascular puncture avoided.  Image printed for medical record.  Negative aspiration and negative test dose prior to incremental administration of local anesthetic. The patient tolerated the procedure well.   Procedure Name: LMA Insertion Date/Time: 05/04/2016 1:12 PM Performed by: Toula Moos L Pre-anesthesia Checklist: Patient identified, Emergency Drugs available, Suction available, Patient being monitored and Timeout performed Patient Re-evaluated:Patient Re-evaluated prior to inductionOxygen Delivery Method: Circle system utilized Preoxygenation: Pre-oxygenation with 100% oxygen Intubation Type: IV induction Ventilation: Mask ventilation without difficulty LMA: LMA inserted LMA Size: 5.0 Number of attempts: 1 Airway  Equipment and Method: Bite block Placement Confirmation: positive ETCO2 Tube secured with: Tape Dental Injury: Teeth and Oropharynx as per pre-operative assessment

## 2016-05-04 NOTE — Discharge Instructions (Addendum)

## 2016-05-04 NOTE — H&P (Signed)
Robert Garner is an 65 y.o. male.   Chief Complaint: right wrist tfcc and lt injury HPI: 65 yo male with injury to right wrist TFCC and SL and LT ligaments and carpal boss.  He has tried splinting, therapy, and injections without lasting relief.  He wishes to have a wrist arthroscopy with debridement and possible repair for management of symptoms.  Allergies:  Allergies  Allergen Reactions  . Hydrocodone Itching    Past Medical History  Diagnosis Date  . Keratitis sicca, right eye   . Seasonal allergies   . Pneumonia 10/2013  . Anxiety     Past Surgical History  Procedure Laterality Date  . Mandible surgery Left 1968  . Reconstruction of nose      x3  . Knee arthroscopy Bilateral   . Repair tendons leg Left 1983  . Hand surgery Bilateral 1996  . Cyst removal leg Left   . Anal fissure repair  1998  . Video bronchoscopy with endobronchial navigation N/A 03/06/2014    Procedure: VIDEO BRONCHOSCOPY WITH ENDOBRONCHIAL NAVIGATION;  Surgeon: Melrose Nakayama, MD;  Location: [facility] OR;  Service: Thoracic;  Laterality: N/A;    Family History: Family History  Problem Relation Age of Onset  . Alcohol abuse Mother   . Cancer Father   . Alcohol abuse Maternal Grandfather   . Hypertension Paternal Grandfather     Social History:   reports that he has been smoking Cigarettes.  He has a 40 pack-year smoking history. He does not have any smokeless tobacco history on file. He reports that he drinks alcohol. He reports that he does not use illicit drugs.  Medications: Medications Prior to Admission  Medication Sig Dispense Refill  . aspirin 81 MG EC tablet Take 81 mg by mouth daily. Swallow whole.    Marland Kitchen BIOTIN PO Take 40 mg by mouth daily.    . Calcium-Vitamin D (CALTRATE 600 PLUS-VIT D PO) Take by mouth daily.    . Cranberry 450 MG TABS Take 900 mg by mouth daily.    . montelukast (SINGULAIR) 10 MG tablet Take 10 mg by mouth daily as needed (allergies).     . Multiple Vitamin  (MULTIVITAMIN) capsule Take 1 capsule by mouth daily.    . Omega-3 Fatty Acids (FISH OIL PO) Take 2 capsules by mouth 2 (two) times daily.    Marland Kitchen OVER THE COUNTER MEDICATION Take 1 tablet by mouth daily. Super B Energy Complex vitamin    . Probiotic Product (SOLUBLE FIBER/PROBIOTICS PO) Take 1 capsule by mouth daily.    . sertraline (ZOLOFT) 50 MG tablet Take 50 mg by mouth daily.     . valACYclovir (VALTREX) 1000 MG tablet Take 1,000 mg by mouth daily.    . vitamin E 400 UNIT capsule Take 400 Units by mouth daily.      No results found for this or any previous visit (from the past 48 hour(s)).  No results found.   A comprehensive review of systems was negative except for: Neurological: positive for paresthesia  Blood pressure 133/83, pulse 85, temperature 97.8 F (36.6 C), temperature source Oral, resp. rate 21, height 5\' 8"  (1.727 m), weight 87.091 kg (192 lb), SpO2 92 %.  General appearance: alert, cooperative and appears stated age Head: Normocephalic, without obvious abnormality, atraumatic Neck: supple, symmetrical, trachea midline Resp: clear to auscultation bilaterally Cardio: regular rate and rhythm GI: non-tender Extremities: Intact sensation and capillary refill all digits.  +epl/fpl/io.  No wounds.  Pulses: 2+ and symmetric  Skin: Skin color, texture, turgor normal. No rashes or lesions Neurologic: Grossly normal Incision/Wound:none  Assessment/Plan Right wrist pain with tfcc, sl, and lt injuries.  Plan wrist arthroscopy with debridement and possible repair as indicated.  Will also inject the carpal boss.  Risks, benefits, and alternatives of surgery were discussed and the patient agrees with the plan of care.   Morris Markham R 05/04/2016, 12:30 PM

## 2016-05-04 NOTE — Op Note (Signed)
I assisted Surgeon(s) and Role:    * Leanora Cover, MD - Primary    * Daryll Brod, MD - Assisting on the Procedure(s): RIGHT WRIST ARTHROSCOPY WITH DEBRIDEMENT   TRIANGULAR FIBROCARTILAGE COMPLEX TEAR STEROID INJECTION OF RIGHT INDEX [place] on 05/04/2016.  I provided assistance on this case as follows: setup, stabilization of the arm, placement of the traction tower, interpretation of images, closure and splinting. I was present for the entire case.  Electronically signed by: Wynonia Sours, MD Date: 05/04/2016 Time: 1:52 PM

## 2016-05-04 NOTE — Anesthesia Postprocedure Evaluation (Signed)
Anesthesia Post Note  Patient: Robert Garner  Procedure(s) Performed: Procedure(s) (LRB): RIGHT WRIST ARTHROSCOPY WITH DEBRIDEMENT   TRIANGULAR FIBROCARTILAGE COMPLEX TEAR (Right) STEROID INJECTION OF RIGHT INDEX CARPALMETACARPAL JOINT (Right)  Patient location during evaluation: PACU Anesthesia Type: General Level of consciousness: sedated Pain management: pain level controlled Vital Signs Assessment: post-procedure vital signs reviewed and stable Respiratory status: spontaneous breathing and respiratory function stable Cardiovascular status: stable Anesthetic complications: no    Last Vitals:  Filed Vitals:   05/04/16 1430 05/04/16 1445  BP: 128/87 139/94  Pulse: 75 79  Temp:    Resp: 24 19    Last Pain:  Filed Vitals:   05/04/16 1450  PainSc: 0-No pain                 Davinia Riccardi DANIEL

## 2016-05-04 NOTE — Brief Op Note (Signed)
05/04/2016  1:53 PM  PATIENT:  Robert Garner  65 y.o. male  PRE-OPERATIVE DIAGNOSIS:  RIGHT WRIST TRIANGULAR FIBROCARTILAGE COMPLEX TEAR WITH LUNOTRIQUETRAL TEAR SCAPHOLUNATE TEAR RIGHT CARPAL BOSS   POST-OPERATIVE DIAGNOSIS:  RIGHT WRIST TRIANGULAR FIBROCARTILAGE COMPLEX TEAR WITH LUNOTRIQUETRAL TEAR SCAPHOLUNATE TEAR RIGHT CARPAL BOSS   PROCEDURE:  Procedure(s): RIGHT WRIST ARTHROSCOPY WITH DEBRIDEMENT   TRIANGULAR FIBROCARTILAGE COMPLEX TEAR (Right) STEROID INJECTION OF RIGHT INDEX CARPALMETACARPAL JOINT (Right)  SURGEON:  Surgeon(s) and Role:    * Leanora Cover, MD - Primary    * Daryll Brod, MD - Assisting  PHYSICIAN ASSISTANT:   ASSISTANTS: Daryll Brod, MD   ANESTHESIA:   general  EBL:  Total I/O In: 1000 [I.V.:1000] Out: 4 [Blood:4]  BLOOD ADMINISTERED:none  DRAINS: none   LOCAL MEDICATIONS USED:  NONE  SPECIMEN:  No Specimen  DISPOSITION OF SPECIMEN:  N/A  COUNTS:  YES  TOURNIQUET:    DICTATION: .Other Dictation: Dictation Number    PLAN OF CARE: Discharge to home after PACU  PATIENT DISPOSITION:  PACU - hemodynamically stable.

## 2016-05-04 NOTE — Transfer of Care (Signed)
Immediate Anesthesia Transfer of Care Note  Patient: Robert Garner  Procedure(s) Performed: Procedure(s): RIGHT WRIST ARTHROSCOPY WITH DEBRIDEMENT   TRIANGULAR FIBROCARTILAGE COMPLEX TEAR (Right) STEROID INJECTION OF RIGHT INDEX CARPALMETACARPAL JOINT (Right)  Patient Location: PACU  Anesthesia Type:GA combined with regional for post-op pain  Level of Consciousness: sedated  Airway & Oxygen Therapy: Patient Spontanous Breathing and Patient connected to face mask oxygen  Post-op Assessment: Report given to RN and Post -op Vital signs reviewed and stable  Post vital signs: Reviewed and stable  Last Vitals:  Filed Vitals:   05/04/16 1140 05/04/16 1145  BP: 133/83   Pulse: 84 85  Temp:    Resp: 20 21    Last Pain: There were no vitals filed for this visit.       Complications: No apparent anesthesia complications

## 2016-05-04 NOTE — Anesthesia Preprocedure Evaluation (Signed)
Anesthesia Evaluation  Patient identified by MRN, date of birth, ID band Patient awake    Reviewed: Allergy & Precautions, H&P , NPO status , Patient's Chart, lab work & pertinent test results  History of Anesthesia Complications Negative for: history of anesthetic complications  Airway Mallampati: II  TM Distance: >3 FB Neck ROM: Full    Dental  (+) Teeth Intact, Dental Advisory Given   Pulmonary Current Smoker,    breath sounds clear to auscultation       Cardiovascular negative cardio ROS   Rhythm:Regular Rate:Normal     Neuro/Psych Anxiety negative neurological ROS     GI/Hepatic negative GI ROS, Neg liver ROS,   Endo/Other    Renal/GU negative Renal ROS     Musculoskeletal   Abdominal   Peds  Hematology   Anesthesia Other Findings   Reproductive/Obstetrics                             Anesthesia Physical  Anesthesia Plan  ASA: II  Anesthesia Plan: General   Post-op Pain Management: GA combined w/ Regional for post-op pain   Induction: Intravenous  Airway Management Planned: Oral ETT  Additional Equipment:   Intra-op Plan:   Post-operative Plan: Extubation in OR  Informed Consent: I have reviewed the patients History and Physical, chart, labs and discussed the procedure including the risks, benefits and alternatives for the proposed anesthesia with the patient or authorized representative who has indicated his/her understanding and acceptance.   Dental advisory given  Plan Discussed with: CRNA, Anesthesiologist and Surgeon  Anesthesia Plan Comments:         Anesthesia Quick Evaluation

## 2016-05-05 ENCOUNTER — Encounter (HOSPITAL_BASED_OUTPATIENT_CLINIC_OR_DEPARTMENT_OTHER): Payer: Self-pay | Admitting: Orthopedic Surgery

## 2016-05-05 NOTE — Op Note (Addendum)
NAME:  Robert Garner NO.:  [id number]  MEDICAL RECORD NO.:  [id number]  LOCATION:                                 FACILITY:  PHYSICIAN:  Leanora Cover, MD             DATE OF BIRTH:  DATE OF PROCEDURE:  05/04/2016 DATE OF DISCHARGE:                              OPERATIVE REPORT   PREOPERATIVE DIAGNOSIS:  Right wrist scapholunate and lunotriquetral ligament tears and triangular fibrocartilage complex injury.  Right index cmc joint carpal boss.  POSTOPERATIVE DIAGNOSIS:  Right wrist scapholunate and lunotriquetral ligament tears and triangular fibrocartilage complex injury with ulnocarpal abutment.  Right index cmc joint carpal boss.   PROCEDURE:   1. Right wrist arthroscopy with debridement of scapholunate and lunotriquetral ligament tears and triangular fibrocartilage complex tear 2. Injection of index finger carpometacarpal joint.  SURGEON:  Leanora Cover, MD.  ASSISTANT:  Daryll Brod, MD.  ANESTHESIA:  General.  IV FLUIDS:  Per anesthesia flow sheet.  ESTIMATED BLOOD LOSS:  Minimal.  COMPLICATIONS:  None.  SPECIMENS:  None.  TOURNIQUET TIME:  None.  DISPOSITION:  Stable to PACU.  INDICATIONS:  Robert Garner is a 65 year old male who has had pain in his right wrist after a fall at work.  He has tried nonoperative treatment with splinting and injections without relief.  He wishes to have an arthroscopy for diagnosis and debridement, possible repair.  We also will plan injection of the [facility] joint of the right index finger for carpal boss.  Risks, benefits and alternatives of the surgery were discussed including the risk of blood loss; infection; damage to nerves, vessels, tendons, ligaments, bone; failure of surgery; need for additional surgery; complications with wound healing; continued pain; and stiffness.  He voiced understanding of these risks and elected to proceed.  OPERATIVE COURSE:  After being identified preoperatively by myself,  the patient and I agreed upon the procedure and site of procedure.  Surgical site was marked.  Risks, benefits, and alternatives of the surgery were reviewed and he wished to proceed.  Surgical consent had been signed. He was given IV Ancef as preoperative antibiotic prophylaxis.  He was transferred to the operating room and placed on the operating table in supine position with the right upper extremity on an armboard.  General anesthesia was induced by anesthesiologist.  A regional block had been performed by Anesthesia in preoperative holding.  The right upper extremity was prepped and draped in normal sterile orthopedic fashion. A surgical pause was performed between the surgeons, anesthesia, and operating room staff, and all were in agreement as to the patient, procedure and site of procedure.  Tourniquet at the proximal aspect of the arm was never inflated.  The wrist and hand were secured in the arthroscopy tower.  The [facility] joint of the index finger was injected first with 0.5 mL of 1% plain lidocaine and 0.5 mL of Celestone.  A 3-4 portal was then made through the skin only.  These subcutaneous tissues were entered by spreading technique and the joint was entered.  After insufflation with 10 mL of sterile saline, the camera was introduced. The joint was examined.  There  was stretching of the SL ligament and thinning at the ulnar side especially.  The volar wrist ligaments were intact, but frayed.  The LT ligament was torn and there was fraying of the cartilage on the lunate insertion.  The TFCC was torn from the radius centrally.  The volar compensation appeared intact.  The shaver was introduced through the 4-5 portal.  It was used to debride the SL and LT ligament tears as well as the central tearing of the TFCC.  The ulnar head was visualized and it had been partially denuded.  The camera was introduced through the 4-5 portal.  The ulnar side of the lunate had been denuded of  cartilage and the subchondral bone exposed.  It was felt this was due to ulnocarpal abutment.  The camera was then introduced into the midcarpal joint.  There was step-off at the LT interval and fraying at the SL interval.  The capitohamate joint appeared torn as well.  There was some fraying of the cartilage at the proximal end of the capitate.  Arthroscopic equipment was removed.  The portals were closed with 4-0 nylon in a horizontal mattress fashion.  They were dressed with sterile Xeroform, 4x4s, and wrapped with a Kerlix bandage. A volar splint was placed and wrapped with Kerlix and Ace bandage. Tourniquet had never been inflated.  The operative drapes were broken down.  The patient was awakened from anesthesia safely.  He was transferred back to the stretcher and taken to PACU in stable condition. I will see him back in the office in 1 week for postoperative followup. I will give him Norco 5/325, 1-2 p.o. q.6 hours p.r.n. pain, dispensed #30.     Leanora Cover, MD     KK/MEDQ  D:  05/04/2016  T:  05/05/2016  Job:  [id number]  Addendum (05/11/16): Diagnoses edited for accuracy.  In body carpal loss fixed to say carpal boss.

## 2017-12-04 ENCOUNTER — Other Ambulatory Visit: Payer: Self-pay | Admitting: Urology

## 2017-12-04 DIAGNOSIS — R310 Gross hematuria: Secondary | ICD-10-CM

## 2017-12-12 ENCOUNTER — Ambulatory Visit (HOSPITAL_COMMUNITY)
Admission: RE | Admit: 2017-12-12 | Discharge: 2017-12-12 | Disposition: A | Discharge status: Home / Self Care | Payer: Self-pay | Source: Ambulatory Visit | Attending: Urology | Admitting: Urology

## 2017-12-12 DIAGNOSIS — R9341 Abnormal radiologic findings on diagnostic imaging of renal pelvis, ureter, or bladder: Secondary | ICD-10-CM | POA: Insufficient documentation

## 2017-12-12 DIAGNOSIS — N281 Cyst of kidney, acquired: Secondary | ICD-10-CM | POA: Insufficient documentation

## 2017-12-12 DIAGNOSIS — I700 Atherosclerosis of aorta: Secondary | ICD-10-CM | POA: Insufficient documentation

## 2017-12-12 DIAGNOSIS — N433 Hydrocele, unspecified: Secondary | ICD-10-CM | POA: Insufficient documentation

## 2017-12-12 DIAGNOSIS — R310 Gross hematuria: Secondary | ICD-10-CM | POA: Insufficient documentation

## 2017-12-12 MED ORDER — IOPAMIDOL (ISOVUE-300) INJECTION 61%
INTRAVENOUS | Status: AC
  Administered 2017-12-12: 125 mL
  Filled 2017-12-12: qty 150

## 2017-12-12 MED ORDER — SODIUM CHLORIDE 0.9 % IV SOLN
INTRAVENOUS | Status: AC
  Filled 2017-12-12: qty 250

## 2018-03-01 ENCOUNTER — Other Ambulatory Visit: Payer: Self-pay | Admitting: Urology

## 2018-09-17 DIAGNOSIS — H21233 Degeneration of iris (pigmentary), bilateral: Secondary | ICD-10-CM | POA: Diagnosis not present

## 2018-09-17 DIAGNOSIS — H2513 Age-related nuclear cataract, bilateral: Secondary | ICD-10-CM | POA: Diagnosis not present

## 2018-09-17 DIAGNOSIS — H04123 Dry eye syndrome of bilateral lacrimal glands: Secondary | ICD-10-CM | POA: Diagnosis not present

## 2018-09-17 DIAGNOSIS — H40013 Open angle with borderline findings, low risk, bilateral: Secondary | ICD-10-CM | POA: Diagnosis not present

## 2019-02-09 IMAGING — CT CT ABD-PEL WO/W CM
3 of 11 series · 12 of 46 positions shown, 18 images · IV contrast (APPLIED)
Comparison: PET-CT from 02/18/2014

CLINICAL DATA: Gross hematuria.

EXAM:
CT ABDOMEN AND PELVIS WITHOUT AND WITH CONTRAST
TECHNIQUE: Multidetector CT imaging of the abdomen and pelvis was performed
following the standard protocol before and following the bolus
administration of intravenous contrast.
CONTRAST:  125 cc of Isovue 300

[Series 5: axial post · axial · 0.92mm/px · z∈[-548,-408]mm · 3 of 101 slices shown]
[im 15/101  soft-tissue]
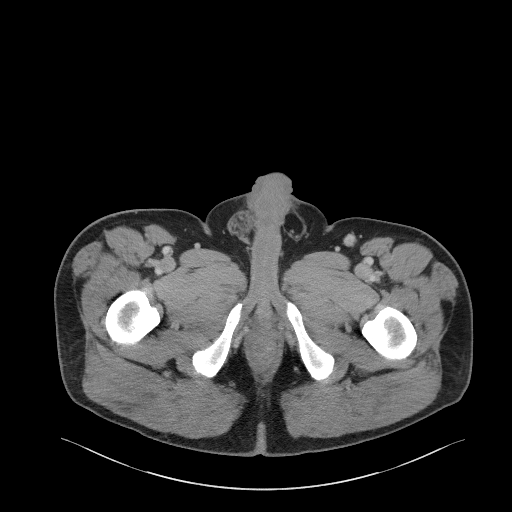
[im 29/101  soft-tissue]
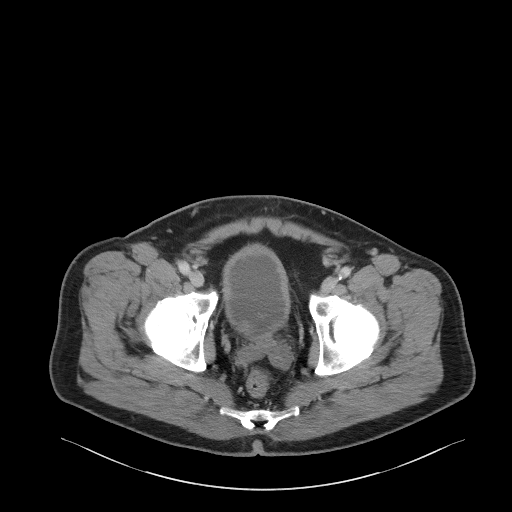
[im 43/101  soft-tissue]
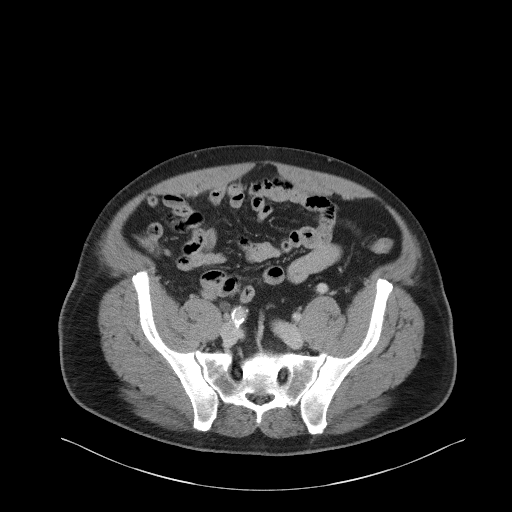

[Series 9: coronal post · coronal · 0.78mm/px · 2 of 100 slices shown, 3 images]
[im 34/100  soft-tissue]
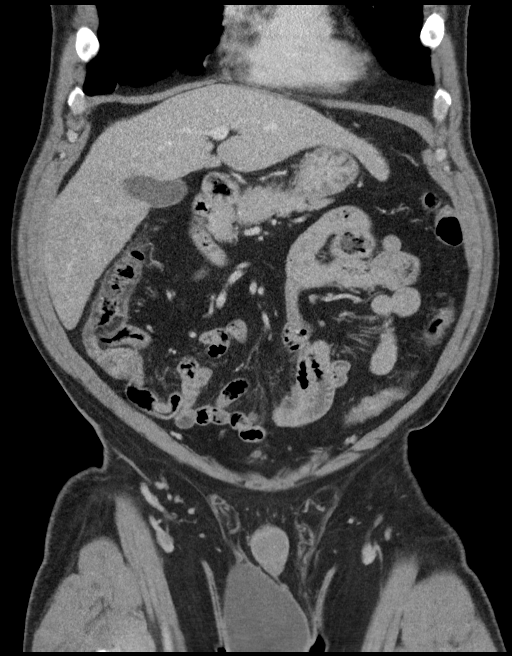
[im 34/100  bone]
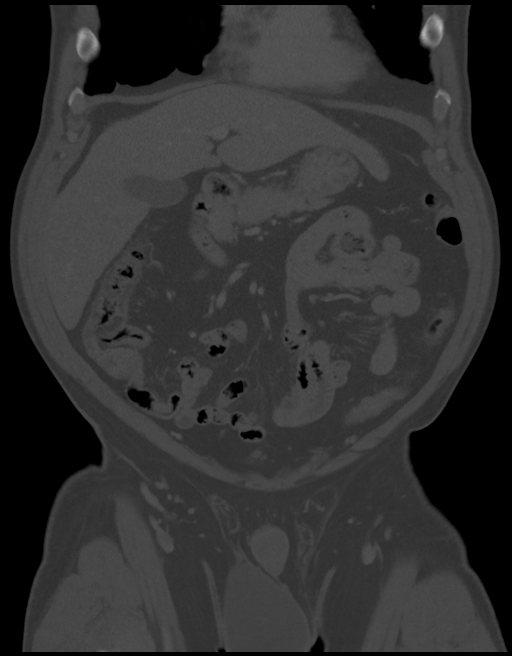
[im 67/100  soft-tissue]
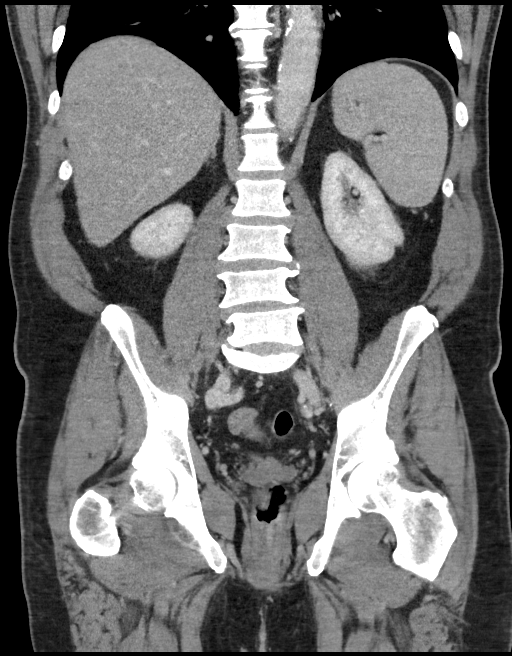

[Series 13: axial delay · axial · delayed · 0.89mm/px · z∈[-496,-96]mm · 7 of 108 slices shown, 12 images]
[im 14/108  soft-tissue]
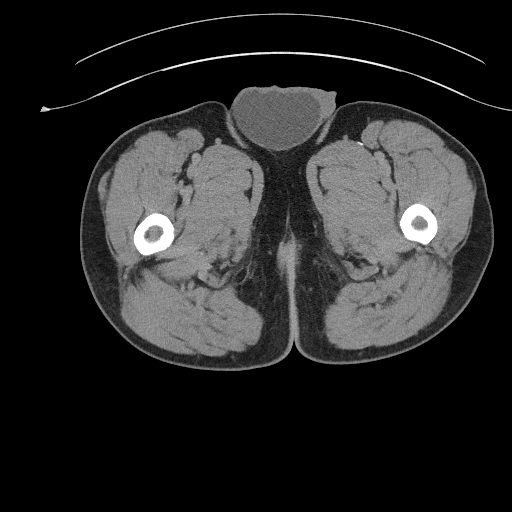
[im 14/108  bone]
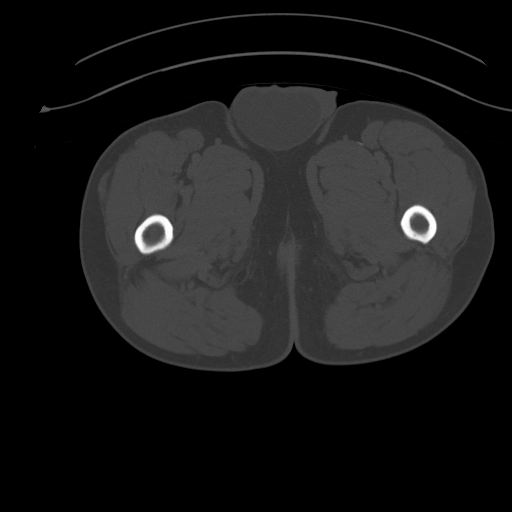
[im 27/108  soft-tissue]
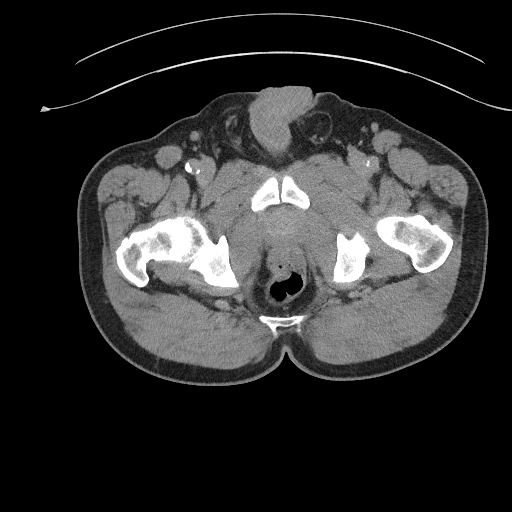
[im 41/108  soft-tissue]
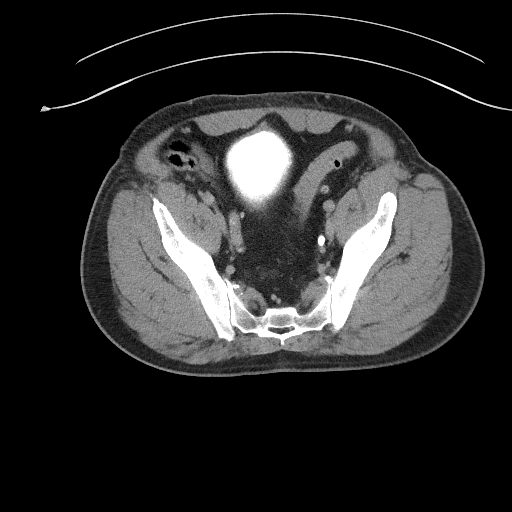
[im 54/108  soft-tissue]
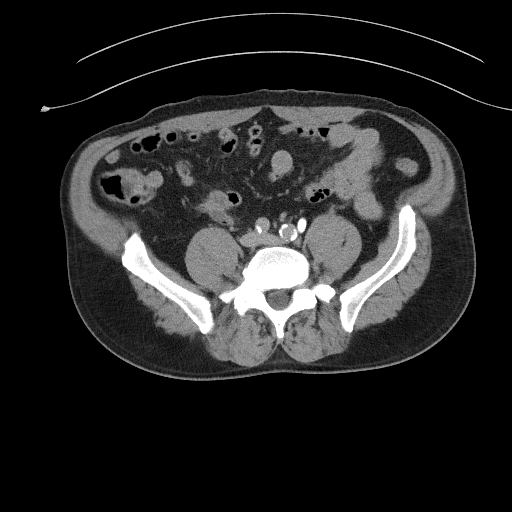
[im 54/108  lung]
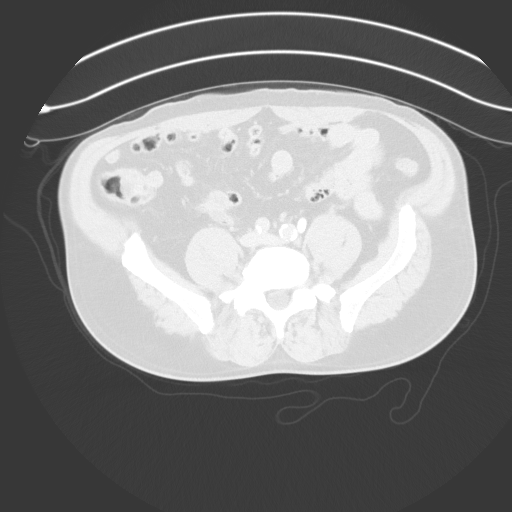
[im 67/108  soft-tissue]
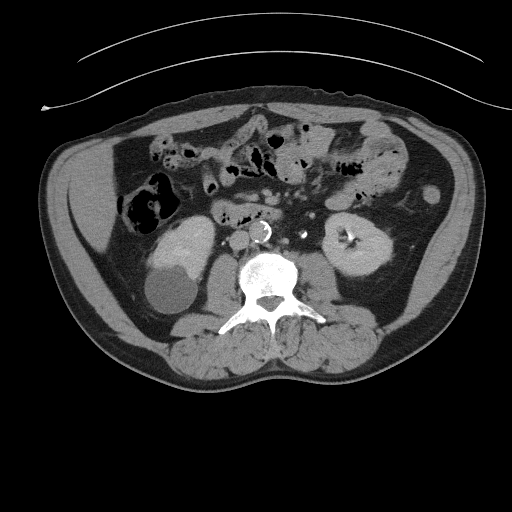
[im 67/108  lung]
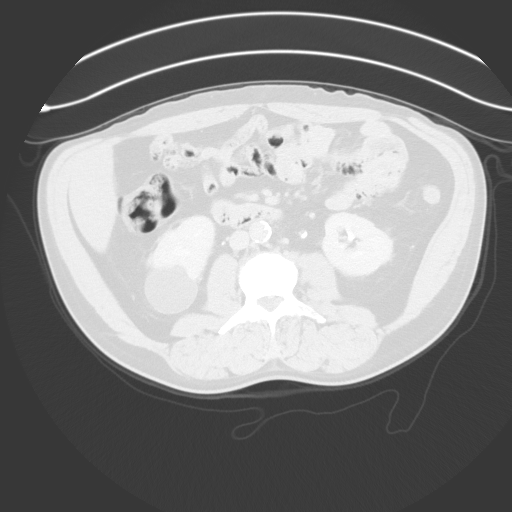
[im 81/108  soft-tissue]
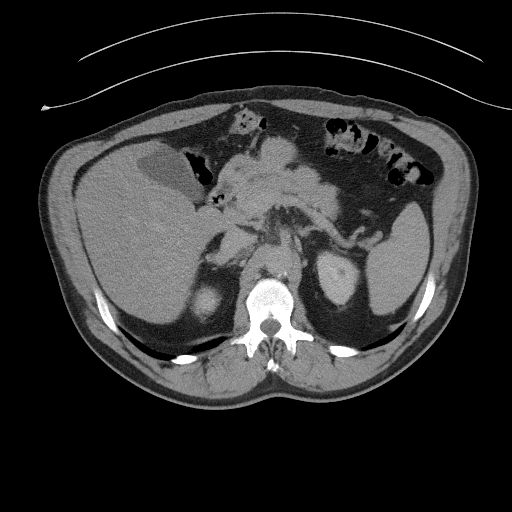
[im 81/108  lung]
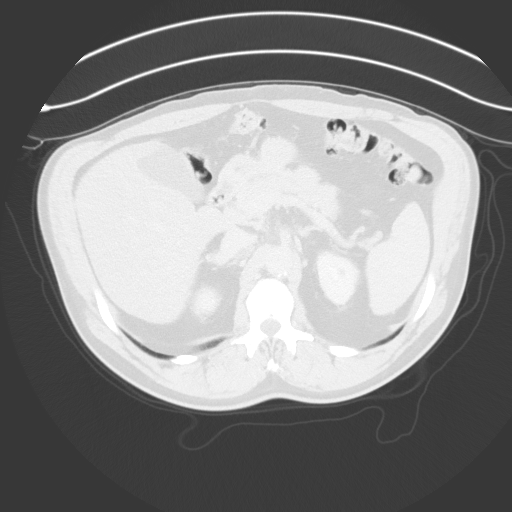
[im 94/108  soft-tissue]
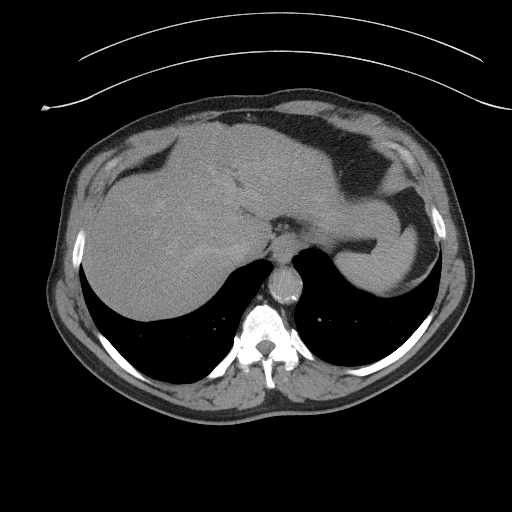
[im 94/108  lung]
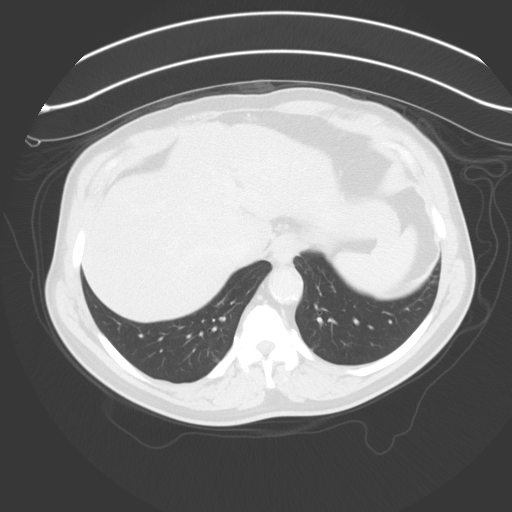

[12 of 46 positions shown; findings below may reference images not displayed]

FINDINGS: Lower chest: No acute abnormality.

Hepatobiliary: No focal liver abnormality is seen. No gallstones,
gallbladder wall thickening, or biliary dilatation.

Pancreas: Unremarkable. No pancreatic ductal dilatation or
surrounding inflammatory changes.

Spleen: Normal in size without focal abnormality.

Adrenals/Urinary Tract: The adrenal glands are normal. No kidney
stones identified. Bilateral kidney cysts are identified. The
largest cyst arises from the inferior pole the right kidney
measuring 4.7 cm. No complicated cysts. There is a suspicious
polypoid filling defect arising from the posterolateral bladder near
the UVJ. This measures approximately 1.2 cm, image number 61 of
series 9.

Stomach/Bowel: Stomach is within normal limits. Appendix appears
normal. No evidence of bowel wall thickening, distention, or
inflammatory changes.

Vascular/Lymphatic: Aortic atherosclerosis. No aneurysm identified.
No upper abdominal adenopathy. No pelvic or inguinal adenopathy.

Reproductive: Large right hydrocele. Prostate gland is unremarkable.

Other: None

Musculoskeletal: Degenerative disc disease identified within the
lower thoracic spine. No suspicious bone lesions.
IMPRESSION: 1. There is a small polypoid all filling defect within the right
posterolateral bladder near the UVJ. In the setting of the hematuria
findings may reflect a small urothelial neoplasm. Correlation with
direct visualization advised.
2. Bilateral kidney cysts.
3.  Aortic Atherosclerosis ([id number]).
4. Right hydrocele.

## 2019-09-19 DIAGNOSIS — H2513 Age-related nuclear cataract, bilateral: Secondary | ICD-10-CM | POA: Diagnosis not present

## 2019-09-19 DIAGNOSIS — H40013 Open angle with borderline findings, low risk, bilateral: Secondary | ICD-10-CM | POA: Diagnosis not present

## 2019-09-19 DIAGNOSIS — H04123 Dry eye syndrome of bilateral lacrimal glands: Secondary | ICD-10-CM | POA: Diagnosis not present

## 2019-09-19 DIAGNOSIS — H524 Presbyopia: Secondary | ICD-10-CM | POA: Diagnosis not present

## 2019-09-19 DIAGNOSIS — H25013 Cortical age-related cataract, bilateral: Secondary | ICD-10-CM | POA: Diagnosis not present

## 2020-02-12 DIAGNOSIS — Z20822 Contact with and (suspected) exposure to covid-19: Secondary | ICD-10-CM | POA: Diagnosis not present

## 2020-03-18 DIAGNOSIS — H40013 Open angle with borderline findings, low risk, bilateral: Secondary | ICD-10-CM | POA: Diagnosis not present

## 2020-03-18 DIAGNOSIS — H04123 Dry eye syndrome of bilateral lacrimal glands: Secondary | ICD-10-CM | POA: Diagnosis not present

## 2020-03-18 DIAGNOSIS — H25013 Cortical age-related cataract, bilateral: Secondary | ICD-10-CM | POA: Diagnosis not present

## 2020-03-22 DIAGNOSIS — H2511 Age-related nuclear cataract, right eye: Secondary | ICD-10-CM | POA: Diagnosis not present

## 2020-03-22 DIAGNOSIS — H2513 Age-related nuclear cataract, bilateral: Secondary | ICD-10-CM | POA: Diagnosis not present

## 2020-03-22 DIAGNOSIS — H25013 Cortical age-related cataract, bilateral: Secondary | ICD-10-CM | POA: Diagnosis not present

## 2020-03-23 DIAGNOSIS — B351 Tinea unguium: Secondary | ICD-10-CM | POA: Diagnosis not present

## 2020-03-23 DIAGNOSIS — H35039 Hypertensive retinopathy, unspecified eye: Secondary | ICD-10-CM | POA: Diagnosis not present

## 2020-03-23 DIAGNOSIS — I708 Atherosclerosis of other arteries: Secondary | ICD-10-CM | POA: Diagnosis not present

## 2020-03-23 DIAGNOSIS — R631 Polydipsia: Secondary | ICD-10-CM | POA: Diagnosis not present

## 2020-03-23 DIAGNOSIS — E785 Hyperlipidemia, unspecified: Secondary | ICD-10-CM | POA: Diagnosis not present

## 2020-03-23 DIAGNOSIS — H269 Unspecified cataract: Secondary | ICD-10-CM | POA: Diagnosis not present

## 2020-03-23 DIAGNOSIS — E1165 Type 2 diabetes mellitus with hyperglycemia: Secondary | ICD-10-CM | POA: Diagnosis not present

## 2020-03-23 DIAGNOSIS — H3509 Other intraretinal microvascular abnormalities: Secondary | ICD-10-CM | POA: Diagnosis not present

## 2020-03-30 DIAGNOSIS — E1165 Type 2 diabetes mellitus with hyperglycemia: Secondary | ICD-10-CM | POA: Diagnosis not present

## 2020-03-30 DIAGNOSIS — F418 Other specified anxiety disorders: Secondary | ICD-10-CM | POA: Diagnosis not present

## 2020-03-31 DIAGNOSIS — H25811 Combined forms of age-related cataract, right eye: Secondary | ICD-10-CM | POA: Diagnosis not present

## 2020-03-31 DIAGNOSIS — H2511 Age-related nuclear cataract, right eye: Secondary | ICD-10-CM | POA: Diagnosis not present

## 2020-04-06 DIAGNOSIS — F430 Acute stress reaction: Secondary | ICD-10-CM | POA: Diagnosis not present

## 2020-04-06 DIAGNOSIS — H2512 Age-related nuclear cataract, left eye: Secondary | ICD-10-CM | POA: Diagnosis not present

## 2020-04-06 DIAGNOSIS — H25012 Cortical age-related cataract, left eye: Secondary | ICD-10-CM | POA: Diagnosis not present

## 2020-04-06 DIAGNOSIS — E11319 Type 2 diabetes mellitus with unspecified diabetic retinopathy without macular edema: Secondary | ICD-10-CM | POA: Diagnosis not present

## 2020-04-14 DIAGNOSIS — H25812 Combined forms of age-related cataract, left eye: Secondary | ICD-10-CM | POA: Diagnosis not present

## 2020-04-14 DIAGNOSIS — H2512 Age-related nuclear cataract, left eye: Secondary | ICD-10-CM | POA: Diagnosis not present

## 2020-04-14 DIAGNOSIS — H25012 Cortical age-related cataract, left eye: Secondary | ICD-10-CM | POA: Diagnosis not present

## 2020-04-19 ENCOUNTER — Ambulatory Visit: Payer: Medicare HMO | Admitting: Podiatry

## 2020-04-19 ENCOUNTER — Other Ambulatory Visit: Payer: Self-pay

## 2020-04-19 DIAGNOSIS — L84 Corns and callosities: Secondary | ICD-10-CM | POA: Diagnosis not present

## 2020-04-19 DIAGNOSIS — M722 Plantar fascial fibromatosis: Secondary | ICD-10-CM | POA: Diagnosis not present

## 2020-04-19 DIAGNOSIS — M7741 Metatarsalgia, right foot: Secondary | ICD-10-CM | POA: Diagnosis not present

## 2020-04-19 DIAGNOSIS — B351 Tinea unguium: Secondary | ICD-10-CM

## 2020-04-19 DIAGNOSIS — M7742 Metatarsalgia, left foot: Secondary | ICD-10-CM

## 2020-04-19 DIAGNOSIS — E119 Type 2 diabetes mellitus without complications: Secondary | ICD-10-CM | POA: Diagnosis not present

## 2020-04-19 NOTE — Patient Instructions (Signed)
Diabetes Mellitus and Foot Care Foot care is an important part of your health, especially when you have diabetes. Diabetes may cause you to have problems because of poor blood flow (circulation) to your feet and legs, which can cause your skin to:  Become thinner and drier.  Break more easily.  Heal more slowly.  Peel and crack. You may also have nerve damage (neuropathy) in your legs and feet, causing decreased feeling in them. This means that you may not notice minor injuries to your feet that could lead to more serious problems. Noticing and addressing any potential problems early is the best way to prevent future foot problems. How to care for your feet Foot hygiene  Wash your feet daily with warm water and mild soap. Do not use hot water. Then, pat your feet and the areas between your toes until they are completely dry. Do not soak your feet as this can dry your skin.  Trim your toenails straight across. Do not dig under them or around the cuticle. File the edges of your nails with an emery board or nail file.  Apply a moisturizing lotion or petroleum jelly to the skin on your feet and to dry, brittle toenails. Use lotion that does not contain alcohol and is unscented. Do not apply lotion between your toes. Shoes and socks  Wear clean socks or stockings every day. Make sure they are not too tight. Do not wear knee-high stockings since they may decrease blood flow to your legs.  Wear shoes that fit properly and have enough cushioning. Always look in your shoes before you put them on to be sure there are no objects inside.  To break in new shoes, wear them for just a few hours a day. This prevents injuries on your feet. Wounds, scrapes, corns, and calluses  Check your feet daily for blisters, cuts, bruises, sores, and redness. If you cannot see the bottom of your feet, use a mirror or ask someone for help.  Do not cut corns or calluses or try to remove them with medicine.  If you  find a minor scrape, cut, or break in the skin on your feet, keep it and the skin around it clean and dry. You may clean these areas with mild soap and water. Do not clean the area with peroxide, alcohol, or iodine.  If you have a wound, scrape, corn, or callus on your foot, look at it several times a day to make sure it is healing and not infected. Check for: ? Redness, swelling, or pain. ? Fluid or blood. ? Warmth. ? Pus or a bad smell. General instructions  Do not cross your legs. This may decrease blood flow to your feet.  Do not use heating pads or hot water bottles on your feet. They may burn your skin. If you have lost feeling in your feet or legs, you may not know this is happening until it is too late.  Protect your feet from hot and cold by wearing shoes, such as at the beach or on hot pavement.  Schedule a complete foot exam at least once a year (annually) or more often if you have foot problems. If you have foot problems, report any cuts, sores, or bruises to your health care provider immediately. Contact a health care provider if:  You have a medical condition that increases your risk of infection and you have any cuts, sores, or bruises on your feet.  You have an injury that is not   healing.  You have redness on your legs or feet.  You feel burning or tingling in your legs or feet.  You have pain or cramps in your legs and feet.  Your legs or feet are numb.  Your feet always feel cold.  You have pain around a toenail. Get help right away if:  You have a wound, scrape, corn, or callus on your foot and: ? You have pain, swelling, or redness that gets worse. ? You have fluid or blood coming from the wound, scrape, corn, or callus. ? Your wound, scrape, corn, or callus feels warm to the touch. ? You have pus or a bad smell coming from the wound, scrape, corn, or callus. ? You have a fever. ? You have a red line going up your leg. Summary  Check your feet every day  for cuts, sores, red spots, swelling, and blisters.  Moisturize feet and legs daily.  Wear shoes that fit properly and have enough cushioning.  If you have foot problems, report any cuts, sores, or bruises to your health care provider immediately.  Schedule a complete foot exam at least once a year (annually) or more often if you have foot problems. This information is not intended to replace advice given to you by your health care provider. Make sure you discuss any questions you have with your health care provider. Document Revised: 08/06/2019 Document Reviewed: 12/15/2016 Elsevier Patient Education  2020 [employer] Inc.  

## 2020-04-20 NOTE — Progress Notes (Signed)
Subjective:   Patient ID: Robert Garner, male   DOB: 69 y.o.   MRN: [id number]   HPI 69 year old male presents the office today for concerns of his left fourth toenail becoming thicker as well as more of a brown discoloration to his nail.  Denies any black discoloration currently darker streaks.  He has a history injury to the nail many years ago but since then nails becoming more thickened discolored recently.  He also previously had his left big toenail removed primary care physician at the nail is loose but not causing any issues currently.  He has some generalized discomfort to his feet when he goes barefoot on hardwood floors at home.  He is wearing shoes there is no discomfort.  He gets calluses to the poles of both of his feet pointing submetatarsal 5.  Denies any ulcerations.  Recently diagnosed with diabetes about 6 weeks ago.  Last morning blood sugar was 115.  Unsure of his A1c.  No claudication symptoms.  Review of Systems  All other systems reviewed and are negative.  Past Medical History:  Diagnosis Date  . Anxiety   . Keratitis sicca, right eye   . Pneumonia 10/2013  . Seasonal allergies     Past Surgical History:  Procedure Laterality Date  . ANAL FISSURE REPAIR  1998  . CYST REMOVAL LEG Left   . HAND SURGERY Bilateral 1996  . KNEE ARTHROSCOPY Bilateral   . MANDIBLE SURGERY Left 1968  . RECONSTRUCTION OF NOSE     x3  . REPAIR TENDONS LEG Left 1983  . STERIOD INJECTION Right 05/04/2016   Procedure: STEROID INJECTION OF RIGHT INDEX CARPALMETACARPAL JOINT;  Surgeon: Leanora Cover, MD;  Location: [facility];  Service: Orthopedics;  Laterality: Right;  Marland Kitchen VIDEO BRONCHOSCOPY WITH ENDOBRONCHIAL NAVIGATION N/A 03/06/2014   Procedure: VIDEO BRONCHOSCOPY WITH ENDOBRONCHIAL NAVIGATION;  Surgeon: Melrose Nakayama, MD;  Location: [facility];  Service: Thoracic;  Laterality: N/A;  . WRIST ARTHROSCOPY WITH DEBRIDEMENT Right 05/04/2016   Procedure: RIGHT WRIST ARTHROSCOPY WITH  DEBRIDEMENT   TRIANGULAR FIBROCARTILAGE COMPLEX TEAR;  Surgeon: Leanora Cover, MD;  Location: [facility];  Service: Orthopedics;  Laterality: Right;     Current Outpatient Medications:  .  amoxicillin-clavulanate (AUGMENTIN) 500-125 MG tablet, Take by mouth., Disp: , Rfl:  .  buPROPion (WELLBUTRIN SR) 100 MG 12 hr tablet, TAKE 1 TABLET BY MOUTH EVERY MORNING TWICE A DAY, Disp: , Rfl:  .  celecoxib (CELEBREX) 200 MG capsule, Take by mouth., Disp: , Rfl:  .  nicotine (NICODERM CQ - DOSED IN MG/24 HOURS) 14 mg/24hr patch, PLACE 1 PATCH ONTO SKIN EVERY DAY, Disp: , Rfl:  .  aspirin 81 MG EC tablet, Take 81 mg by mouth daily. Swallow whole., Disp: , Rfl:  .  BIOTIN PO, Take 40 mg by mouth daily., Disp: , Rfl:  .  Calcium-Vitamin D (CALTRATE 600 PLUS-VIT D PO), Take by mouth daily., Disp: , Rfl:  .  Cranberry 450 MG TABS, Take 900 mg by mouth daily., Disp: , Rfl:  .  diazepam (VALIUM) 2 MG tablet, , Disp: , Rfl:  .  LORazepam (ATIVAN) 0.5 MG tablet, , Disp: , Rfl:  .  metFORMIN (GLUCOPHAGE-XR) 500 MG 24 hr tablet, , Disp: , Rfl:  .  montelukast (SINGULAIR) 10 MG tablet, Take 10 mg by mouth daily as needed (allergies). , Disp: , Rfl:  .  Multiple Vitamin (MULTIVITAMIN) capsule, Take 1 capsule by mouth daily., Disp: , Rfl:  .  NOVOFINE 32G X 6 MM MISC, , Disp: , Rfl:  .  Omega-3 Fatty Acids (FISH OIL PO), Take 2 capsules by mouth 2 (two) times daily., Disp: , Rfl:  .  OVER THE COUNTER MEDICATION, Take 1 tablet by mouth daily. Super B Energy Complex vitamin, Disp: , Rfl:  .  Probiotic Product (SOLUBLE FIBER/PROBIOTICS PO), Take 1 capsule by mouth daily., Disp: , Rfl:  .  rosuvastatin (CRESTOR) 20 MG tablet, , Disp: , Rfl:  .  sertraline (ZOLOFT) 50 MG tablet, Take 50 mg by mouth daily. , Disp: , Rfl:  .  SYSTANE ULTRA 0.4-0.3 % SOLN, SMARTSIG:1 Drop(s) In Eye(s) PRN, Disp: , Rfl:  .  traMADol (ULTRAM) 50 MG tablet, 1-2 tabs po q6 hours prn pain, Disp: 30 tablet, Rfl: 0 .  TRESIBA  FLEXTOUCH 100 UNIT/ML FlexTouch Pen, , Disp: , Rfl:  .  valACYclovir (VALTREX) 1000 MG tablet, Take 1,000 mg by mouth daily., Disp: , Rfl:  .  vitamin E 400 UNIT capsule, Take 400 Units by mouth daily., Disp: , Rfl:   Allergies  Allergen Reactions  . Other Itching  . Hydrocodone Itching         Objective:  Physical Exam  General: AAO x3, NAD  Dermatological: Hyperkeratotic tissue bilateral submetatarsal 5 without any underlying ulceration drainage or signs of infection.  Nails are mildly hypertrophic, dystrophic with yellow-brown discoloration of the left fourth toenail the same.  It seems that the hallux toenail left side certainly grow back in.  There is no pain in the nails there is no redness or drainage or any signs of infection.  No open lesions.  Vascular: Dorsalis Pedis artery and Posterior Tibial artery pedal pulses are 2/4 bilateral with immedate capillary fill time.  There is no pain with calf compression, swelling, warmth, erythema.   Neruologic: Grossly intact via light touch bilateral.Protective threshold with Semmes Wienstein monofilament intact to all pedal sites bilateral.   Musculoskeletal: There is no area pinpoint tenderness identified today.  Flexor, extensor tendons appear to be intact.  Upon palpation of medial band plantar fascia as well as the ball of the foot started get discomfort going barefoot but no significant pain today.  There is mild atrophy of the fat pad on plantar metatarsal heads.  Muscular strength 5/5 in all groups tested bilateral.  Gait: Unassisted, Nonantalgic.   Assessment:   69 year old male with symptomatic hyperkeratotic lesions, metatarsalgia; onychomycosis; diabetic foot exam     Plan:  -Treatment options discussed including all alternatives, risks, and complications -Etiology of symptoms were discussed -Debrided the hyperkeratotic lesions x2 without any complications or bleeding.  I dispensed offloading pads, metatarsal pads.   Discussed moisturizer daily.  We discussed treatment options for nail fungus we like to hold off on any treatment at this time.  He changes his mind on the nail.  Discussed wearing shoes at home particularly now that he is diabetic not to go barefoot.  We discussed stretching, icing for plantar fasciitis as well.  Discussed daily foot inspection.  Return in about 6 months (around 10/20/2020).  Diabetic foot check.  Trula Slade DPM

## 2020-05-11 ENCOUNTER — Other Ambulatory Visit: Payer: Self-pay

## 2020-05-11 ENCOUNTER — Encounter: Payer: Medicare HMO | Attending: Family Medicine | Admitting: Dietician

## 2020-05-11 DIAGNOSIS — E119 Type 2 diabetes mellitus without complications: Secondary | ICD-10-CM | POA: Insufficient documentation

## 2020-05-14 ENCOUNTER — Encounter: Payer: Self-pay | Admitting: Dietician

## 2020-05-14 NOTE — Progress Notes (Signed)
Patient was seen on 05/11/2020 for the first of a series of three diabetes self-management courses at the [facility].  Patient Education Plan per assessed needs and concerns is to attend three course education program for Diabetes Self Management Education.  The following learning objectives were met by the patient during this class:  Describe diabetes, types of diabetes and pathophysiology  State some common risk factors for diabetes  Defines the role of glucose and insulin  Describe the relationship between diabetes and cardiovascular and other risks  State the members of the Healthcare Team  States the rationale for glucose monitoring and when to test  State their individual Target Range  State the importance of logging glucose readings and how to interpret the readings  Identifies A1C target  Explain the correlation between A1c and eAG values  State symptoms and treatment of high blood glucose and low blood glucose  Explain proper technique for glucose testing and identify proper sharps disposal  Handouts given during class include:  How to Thrive:  A Guide for Your Journey with Diabetes by the ADA  Meal Plan Card and carbohydrate content list  Dietary intake form  Low Sodium Flavoring Tips  Types of Fats  Dining Out  Label reading  Snack list  Planning a balanced meal  The diabetes portion plate  Diabetes Resources  A1c to eAG Conversion Chart  Blood Glucose Log  Diabetes Recommended Care Schedule  Support Group  Diabetes Success Plan  Core Class Satisfaction Survey   Follow-Up Plan:  Attend core 2   

## 2020-05-18 ENCOUNTER — Encounter: Payer: Self-pay | Admitting: Dietician

## 2020-05-18 ENCOUNTER — Other Ambulatory Visit: Payer: Self-pay

## 2020-05-18 ENCOUNTER — Encounter: Payer: Medicare HMO | Admitting: Dietician

## 2020-05-18 DIAGNOSIS — E119 Type 2 diabetes mellitus without complications: Secondary | ICD-10-CM

## 2020-05-18 NOTE — Progress Notes (Signed)
Patient was seen on 05/18/2020 for the first of a series of three diabetes self-management courses at the [facility].  Patient Education Plan per assessed needs and concerns is to attend three course education program for Diabetes Self Management Education.  The following learning objectives were met by the patient during this class:  Describe diabetes, types of diabetes and pathophysiology  State some common risk factors for diabetes  Defines the role of glucose and insulin  Describe the relationship between diabetes and cardiovascular and other risks  State the members of the Healthcare Team  States the rationale for glucose monitoring and when to test  State their individual [place] the importance of logging glucose readings and how to interpret the readings  Identifies A1C target  Explain the correlation between A1c and eAG values  State symptoms and treatment of high blood glucose and low blood glucose  Explain proper technique for glucose testing and identify proper sharps disposal  Handouts given during class include:  How to Thrive:  A Guide for Your Journey with Diabetes by the ADA  Meal Plan Card and carbohydrate content list  Dietary intake form  Low Sodium Flavoring Tips  Types of Fats  Dining Out  Label reading  Snack list  Planning a balanced meal  The diabetes portion plate  Diabetes Resources  A1c to eAG Conversion Chart  Blood Glucose Log  Diabetes Recommended Care Schedule  Support Group  Diabetes Success Plan  Core Class Satisfaction Survey   Follow-Up Plan:  Attend core 2

## 2020-09-02 DIAGNOSIS — E11319 Type 2 diabetes mellitus with unspecified diabetic retinopathy without macular edema: Secondary | ICD-10-CM | POA: Diagnosis not present

## 2020-09-02 DIAGNOSIS — R945 Abnormal results of liver function studies: Secondary | ICD-10-CM | POA: Diagnosis not present

## 2020-09-02 DIAGNOSIS — Z23 Encounter for immunization: Secondary | ICD-10-CM | POA: Diagnosis not present

## 2020-09-02 DIAGNOSIS — E7800 Pure hypercholesterolemia, unspecified: Secondary | ICD-10-CM | POA: Diagnosis not present

## 2020-09-07 DIAGNOSIS — Z23 Encounter for immunization: Secondary | ICD-10-CM | POA: Diagnosis not present

## 2020-09-07 DIAGNOSIS — Z125 Encounter for screening for malignant neoplasm of prostate: Secondary | ICD-10-CM | POA: Diagnosis not present

## 2020-09-07 DIAGNOSIS — Z0000 Encounter for general adult medical examination without abnormal findings: Secondary | ICD-10-CM | POA: Diagnosis not present

## 2020-09-07 DIAGNOSIS — Z1389 Encounter for screening for other disorder: Secondary | ICD-10-CM | POA: Diagnosis not present

## 2020-10-04 DIAGNOSIS — H40013 Open angle with borderline findings, low risk, bilateral: Secondary | ICD-10-CM | POA: Diagnosis not present

## 2020-10-04 DIAGNOSIS — Z961 Presence of intraocular lens: Secondary | ICD-10-CM | POA: Diagnosis not present

## 2020-10-04 DIAGNOSIS — E119 Type 2 diabetes mellitus without complications: Secondary | ICD-10-CM | POA: Diagnosis not present

## 2020-10-04 DIAGNOSIS — H04123 Dry eye syndrome of bilateral lacrimal glands: Secondary | ICD-10-CM | POA: Diagnosis not present

## 2020-10-25 ENCOUNTER — Other Ambulatory Visit: Payer: Self-pay

## 2020-10-25 ENCOUNTER — Ambulatory Visit: Payer: Medicare HMO | Admitting: Podiatry

## 2020-10-25 DIAGNOSIS — E1149 Type 2 diabetes mellitus with other diabetic neurological complication: Secondary | ICD-10-CM

## 2020-10-25 DIAGNOSIS — E119 Type 2 diabetes mellitus without complications: Secondary | ICD-10-CM

## 2020-10-25 MED ORDER — CICLOPIROX 8 % EX SOLN: Freq: Every day | CUTANEOUS | 2 refills | Status: AC

## 2020-10-25 NOTE — Patient Instructions (Signed)
Start a B-Complex vitamin to help with the neuropathy    Diabetes Mellitus and Foot Care Foot care is an important part of your health, especially when you have diabetes. Diabetes may cause you to have problems because of poor blood flow (circulation) to your feet and legs, which can cause your skin to:  Become thinner and drier.  Break more easily.  Heal more slowly.  Peel and crack. You may also have nerve damage (neuropathy) in your legs and feet, causing decreased feeling in them. This means that you may not notice minor injuries to your feet that could lead to more serious problems. Noticing and addressing any potential problems early is the best way to prevent future foot problems. How to care for your feet Foot hygiene  Wash your feet daily with warm water and mild soap. Do not use hot water. Then, pat your feet and the areas between your toes until they are completely dry. Do not soak your feet as this can dry your skin.  Trim your toenails straight across. Do not dig under them or around the cuticle. File the edges of your nails with an emery board or nail file.  Apply a moisturizing lotion or petroleum jelly to the skin on your feet and to dry, brittle toenails. Use lotion that does not contain alcohol and is unscented. Do not apply lotion between your toes. Shoes and socks  Wear clean socks or stockings every day. Make sure they are not too tight. Do not wear knee-high stockings since they may decrease blood flow to your legs.  Wear shoes that fit properly and have enough cushioning. Always look in your shoes before you put them on to be sure there are no objects inside.  To break in new shoes, wear them for just a few hours a day. This prevents injuries on your feet. Wounds, scrapes, corns, and calluses  Check your feet daily for blisters, cuts, bruises, sores, and redness. If you cannot see the bottom of your feet, use a mirror or ask someone for help.  Do not cut corns  or calluses or try to remove them with medicine.  If you find a minor scrape, cut, or break in the skin on your feet, keep it and the skin around it clean and dry. You may clean these areas with mild soap and water. Do not clean the area with peroxide, alcohol, or iodine.  If you have a wound, scrape, corn, or callus on your foot, look at it several times a day to make sure it is healing and not infected. Check for: ? Redness, swelling, or pain. ? Fluid or blood. ? Warmth. ? Pus or a bad smell. General instructions  Do not cross your legs. This may decrease blood flow to your feet.  Do not use heating pads or hot water bottles on your feet. They may burn your skin. If you have lost feeling in your feet or legs, you may not know this is happening until it is too late.  Protect your feet from hot and cold by wearing shoes, such as at the beach or on hot pavement.  Schedule a complete foot exam at least once a year (annually) or more often if you have foot problems. If you have foot problems, report any cuts, sores, or bruises to your health care provider immediately. Contact a health care provider if:  You have a medical condition that increases your risk of infection and you have any cuts, sores, or  bruises on your feet.  You have an injury that is not healing.  You have redness on your legs or feet.  You feel burning or tingling in your legs or feet.  You have pain or cramps in your legs and feet.  Your legs or feet are numb.  Your feet always feel cold.  You have pain around a toenail. Get help right away if:  You have a wound, scrape, corn, or callus on your foot and: ? You have pain, swelling, or redness that gets worse. ? You have fluid or blood coming from the wound, scrape, corn, or callus. ? Your wound, scrape, corn, or callus feels warm to the touch. ? You have pus or a bad smell coming from the wound, scrape, corn, or callus. ? You have a fever. ? You have a red  line going up your leg. Summary  Check your feet every day for cuts, sores, red spots, swelling, and blisters.  Moisturize feet and legs daily.  Wear shoes that fit properly and have enough cushioning.  If you have foot problems, report any cuts, sores, or bruises to your health care provider immediately.  Schedule a complete foot exam at least once a year (annually) or more often if you have foot problems. This information is not intended to replace advice given to you by your health care provider. Make sure you discuss any questions you have with your health care provider. Document Revised: 08/06/2019 Document Reviewed: 12/15/2016 Elsevier Patient Education  [place].

## 2020-10-27 NOTE — Progress Notes (Signed)
Subjective: 69 year old male presents the office today for diabetic foot evaluation.  He has no concerns other than he has been getting some numbness in his big toes.  No burning pain does not wake up at night either.  Denies any open sores.  No claudication symptoms.  He has no other concerns today. Denies any systemic complaints such as fevers, chills, nausea, vomiting. No acute changes since last appointment, and no other complaints at this time.   Objective: AAO x3, NAD DP/PT pulses palpable bilaterally, CRT less than 3 seconds There is no open lesions identified this time there is no area of tenderness.  Flexor, extensor tendons appear to be intact.  MMT 5/5.  Decreased sensation with Semmes Weinstein monofilament to the hallux bilaterally but no other areas of decreased sensation. No pain with calf compression, swelling, warmth, erythema  Assessment: 69 year old male with type 2 diabetes with neuropathy  Plan: -All treatment options discussed with the patient including all alternatives, risks, complications.  -I do think he is starting to develop early neuropathy symptoms.  We discussed progression of this over time.  Discussed glucose control.  Discussed B complex vitamin.  Discussed daily foot inspection. -Patient encouraged to call the office with any questions, concerns, change in symptoms.   Trula Slade DPM

## 2021-03-04 DIAGNOSIS — F430 Acute stress reaction: Secondary | ICD-10-CM | POA: Diagnosis not present

## 2021-03-04 DIAGNOSIS — E11319 Type 2 diabetes mellitus with unspecified diabetic retinopathy without macular edema: Secondary | ICD-10-CM | POA: Diagnosis not present

## 2021-04-28 ENCOUNTER — Ambulatory Visit: Payer: Medicare HMO | Admitting: Podiatry

## 2021-04-28 ENCOUNTER — Other Ambulatory Visit: Payer: Self-pay

## 2021-04-28 DIAGNOSIS — E1149 Type 2 diabetes mellitus with other diabetic neurological complication: Secondary | ICD-10-CM

## 2021-04-28 DIAGNOSIS — L84 Corns and callosities: Secondary | ICD-10-CM

## 2021-04-28 DIAGNOSIS — B351 Tinea unguium: Secondary | ICD-10-CM | POA: Diagnosis not present

## 2021-04-28 NOTE — Patient Instructions (Signed)
Diabetes Mellitus and Foot Care Foot care is an important part of your health, especially when you have diabetes. Diabetes may cause you to have problems because of poor blood flow (circulation) to your feet and legs, which can cause your skin to:  Become thinner and drier.  Break more easily.  Heal more slowly.  Peel and crack. You may also have nerve damage (neuropathy) in your legs and feet, causing decreased feeling in them. This means that you may not notice minor injuries to your feet that could lead to more serious problems. Noticing and addressing any potential problems early is the best way to prevent future foot problems. How to care for your feet Foot hygiene  Wash your feet daily with warm water and mild soap. Do not use hot water. Then, pat your feet and the areas between your toes until they are completely dry. Do not soak your feet as this can dry your skin.  Trim your toenails straight across. Do not dig under them or around the cuticle. File the edges of your nails with an emery board or nail file.  Apply a moisturizing lotion or petroleum jelly to the skin on your feet and to dry, brittle toenails. Use lotion that does not contain alcohol and is unscented. Do not apply lotion between your toes.   Shoes and socks  Wear clean socks or stockings every day. Make sure they are not too tight. Do not wear knee-high stockings since they may decrease blood flow to your legs.  Wear shoes that fit properly and have enough cushioning. Always look in your shoes before you put them on to be sure there are no objects inside.  To break in new shoes, wear them for just a few hours a day. This prevents injuries on your feet. Wounds, scrapes, corns, and calluses  Check your feet daily for blisters, cuts, bruises, sores, and redness. If you cannot see the bottom of your feet, use a mirror or ask someone for help.  Do not cut corns or calluses or try to remove them with medicine.  If you  find a minor scrape, cut, or break in the skin on your feet, keep it and the skin around it clean and dry. You may clean these areas with mild soap and water. Do not clean the area with peroxide, alcohol, or iodine.  If you have a wound, scrape, corn, or callus on your foot, look at it several times a day to make sure it is healing and not infected. Check for: ? Redness, swelling, or pain. ? Fluid or blood. ? Warmth. ? Pus or a bad smell.   General tips  Do not cross your legs. This may decrease blood flow to your feet.  Do not use heating pads or hot water bottles on your feet. They may burn your skin. If you have lost feeling in your feet or legs, you may not know this is happening until it is too late.  Protect your feet from hot and cold by wearing shoes, such as at the beach or on hot pavement.  Schedule a complete foot exam at least once a year (annually) or more often if you have foot problems. Report any cuts, sores, or bruises to your health care provider immediately. Where to find more information  American Diabetes Association: www.diabetes.org  Association of Diabetes Care & Education Specialists: www.diabeteseducator.org Contact a health care provider if:  You have a medical condition that increases your risk of infection and   you have any cuts, sores, or bruises on your feet.  You have an injury that is not healing.  You have redness on your legs or feet.  You feel burning or tingling in your legs or feet.  You have pain or cramps in your legs and feet.  Your legs or feet are numb.  Your feet always feel cold.  You have pain around any toenails. Get help right away if:  You have a wound, scrape, corn, or callus on your foot and: ? You have pain, swelling, or redness that gets worse. ? You have fluid or blood coming from the wound, scrape, corn, or callus. ? Your wound, scrape, corn, or callus feels warm to the touch. ? You have pus or a bad smell coming from  the wound, scrape, corn, or callus. ? You have a fever. ? You have a red line going up your leg. Summary  Check your feet every day for blisters, cuts, bruises, sores, and redness.  Apply a moisturizing lotion or petroleum jelly to the skin on your feet and to dry, brittle toenails.  Wear shoes that fit properly and have enough cushioning.  If you have foot problems, report any cuts, sores, or bruises to your health care provider immediately.  Schedule a complete foot exam at least once a year (annually) or more often if you have foot problems. This information is not intended to replace advice given to you by your health care provider. Make sure you discuss any questions you have with your health care provider. Document Revised: 06/03/2020 Document Reviewed: 06/03/2020 Elsevier Patient Education  2021 Elsevier Inc.  

## 2021-05-03 NOTE — Progress Notes (Signed)
Subjective: 70 year old male presents the office today for diabetic foot evaluation.  Denies any ulcerations.  Continues to get calluses.  Denies any claudication symptoms.  He said chronic numbness to his big toes does not worsen.  No burning.  Pain does not remember night.  No weakness or falls.   Objective: AAO x3, NAD DP/PT pulses palpable bilaterally, CRT less than 3 seconds Semmes-Weinstein monofilament decreased to the hallux bilaterally but no other areas of decreased sensation Hyperkeratotic lesions right hallux, submetatarsal 5 bilaterally.  No ongoing ulcerations.  Nails are mildly hypertrophic and dystrophic without any significant pain. No pain with calf compression, swelling, warmth, erythema  Assessment: 70 year old male with type 2 diabetes with neuropathy  Plan: -All treatment options discussed with the patient including all alternatives, risks, complications.  -Neuropathy symptoms remain stable.  We will continue to monitor.  As courtesy debrided the callus of the nails with any complications or bleeding.  Discussed daily foot inspection.  Return in about 1 year (around 04/28/2022).  Trula Slade DPM

## 2021-06-07 DIAGNOSIS — M25552 Pain in left hip: Secondary | ICD-10-CM | POA: Diagnosis not present

## 2021-06-10 DIAGNOSIS — M25552 Pain in left hip: Secondary | ICD-10-CM | POA: Diagnosis not present

## 2021-07-02 DIAGNOSIS — M25552 Pain in left hip: Secondary | ICD-10-CM | POA: Diagnosis not present

## 2021-07-08 DIAGNOSIS — M25552 Pain in left hip: Secondary | ICD-10-CM | POA: Diagnosis not present

## 2021-07-13 ENCOUNTER — Other Ambulatory Visit: Payer: Self-pay | Admitting: Orthopedic Surgery

## 2021-07-13 DIAGNOSIS — M25552 Pain in left hip: Secondary | ICD-10-CM

## 2021-07-15 ENCOUNTER — Ambulatory Visit
Admission: RE | Admit: 2021-07-15 | Discharge: 2021-07-15 | Disposition: A | Discharge status: Home / Self Care | Payer: Medicare HMO | Source: Ambulatory Visit | Attending: Orthopedic Surgery | Admitting: Orthopedic Surgery

## 2021-07-15 ENCOUNTER — Other Ambulatory Visit: Payer: Self-pay

## 2021-07-15 DIAGNOSIS — M25552 Pain in left hip: Secondary | ICD-10-CM

## 2021-07-15 MED ORDER — IOPAMIDOL (ISOVUE-M 200) INJECTION 41%
1.0000 mL | Freq: Once | INTRAMUSCULAR | Status: AC
  Administered 2021-07-15: 1 mL via INTRA_ARTICULAR

## 2021-07-15 MED ORDER — METHYLPREDNISOLONE ACETATE 40 MG/ML INJ SUSP (RADIOLOG
80.0000 mg | Freq: Once | INTRAMUSCULAR | Status: AC
  Administered 2021-07-15: 80 mg via INTRA_ARTICULAR

## 2021-08-30 DIAGNOSIS — M1612 Unilateral primary osteoarthritis, left hip: Secondary | ICD-10-CM | POA: Diagnosis not present

## 2021-09-01 DIAGNOSIS — B0052 Herpesviral keratitis: Secondary | ICD-10-CM | POA: Diagnosis not present

## 2021-09-01 DIAGNOSIS — H179 Unspecified corneal scar and opacity: Secondary | ICD-10-CM | POA: Diagnosis not present

## 2021-09-07 DIAGNOSIS — H179 Unspecified corneal scar and opacity: Secondary | ICD-10-CM | POA: Diagnosis not present

## 2021-09-07 DIAGNOSIS — B0052 Herpesviral keratitis: Secondary | ICD-10-CM | POA: Diagnosis not present

## 2021-09-07 DIAGNOSIS — M25552 Pain in left hip: Secondary | ICD-10-CM | POA: Insufficient documentation

## 2021-09-19 DIAGNOSIS — H179 Unspecified corneal scar and opacity: Secondary | ICD-10-CM | POA: Diagnosis not present

## 2021-09-19 DIAGNOSIS — B0052 Herpesviral keratitis: Secondary | ICD-10-CM | POA: Diagnosis not present

## 2021-09-21 DIAGNOSIS — Z0000 Encounter for general adult medical examination without abnormal findings: Secondary | ICD-10-CM | POA: Diagnosis not present

## 2021-09-21 DIAGNOSIS — M25552 Pain in left hip: Secondary | ICD-10-CM | POA: Diagnosis not present

## 2021-09-27 ENCOUNTER — Other Ambulatory Visit: Payer: Self-pay | Admitting: Family Medicine

## 2021-09-27 DIAGNOSIS — F17200 Nicotine dependence, unspecified, uncomplicated: Secondary | ICD-10-CM

## 2021-09-27 DIAGNOSIS — Z136 Encounter for screening for cardiovascular disorders: Secondary | ICD-10-CM

## 2021-10-11 DIAGNOSIS — M1612 Unilateral primary osteoarthritis, left hip: Secondary | ICD-10-CM | POA: Diagnosis not present

## 2021-10-12 ENCOUNTER — Ambulatory Visit
Admission: RE | Admit: 2021-10-12 | Discharge: 2021-10-12 | Disposition: A | Discharge status: Home / Self Care | Payer: Medicare HMO | Source: Ambulatory Visit | Attending: Family Medicine | Admitting: Family Medicine

## 2021-10-12 DIAGNOSIS — F17200 Nicotine dependence, unspecified, uncomplicated: Secondary | ICD-10-CM

## 2021-10-12 DIAGNOSIS — Z136 Encounter for screening for cardiovascular disorders: Secondary | ICD-10-CM

## 2021-10-12 DIAGNOSIS — Z87891 Personal history of nicotine dependence: Secondary | ICD-10-CM | POA: Diagnosis not present

## 2021-10-27 DIAGNOSIS — M533 Sacrococcygeal disorders, not elsewhere classified: Secondary | ICD-10-CM | POA: Diagnosis not present

## 2021-11-10 DIAGNOSIS — I10 Essential (primary) hypertension: Secondary | ICD-10-CM | POA: Diagnosis not present

## 2021-11-10 DIAGNOSIS — Z0000 Encounter for general adult medical examination without abnormal findings: Secondary | ICD-10-CM | POA: Diagnosis not present

## 2021-11-10 DIAGNOSIS — F430 Acute stress reaction: Secondary | ICD-10-CM | POA: Diagnosis not present

## 2021-11-10 DIAGNOSIS — I700 Atherosclerosis of aorta: Secondary | ICD-10-CM | POA: Diagnosis not present

## 2021-11-10 DIAGNOSIS — Z23 Encounter for immunization: Secondary | ICD-10-CM | POA: Diagnosis not present

## 2021-11-10 DIAGNOSIS — E11319 Type 2 diabetes mellitus with unspecified diabetic retinopathy without macular edema: Secondary | ICD-10-CM | POA: Diagnosis not present

## 2021-11-10 DIAGNOSIS — E7800 Pure hypercholesterolemia, unspecified: Secondary | ICD-10-CM | POA: Diagnosis not present

## 2021-11-10 DIAGNOSIS — Z125 Encounter for screening for malignant neoplasm of prostate: Secondary | ICD-10-CM | POA: Diagnosis not present

## 2021-12-13 DIAGNOSIS — H40013 Open angle with borderline findings, low risk, bilateral: Secondary | ICD-10-CM | POA: Diagnosis not present

## 2021-12-13 DIAGNOSIS — B0052 Herpesviral keratitis: Secondary | ICD-10-CM | POA: Diagnosis not present

## 2021-12-13 DIAGNOSIS — E119 Type 2 diabetes mellitus without complications: Secondary | ICD-10-CM | POA: Diagnosis not present

## 2021-12-13 DIAGNOSIS — H179 Unspecified corneal scar and opacity: Secondary | ICD-10-CM | POA: Diagnosis not present

## 2021-12-14 DIAGNOSIS — I10 Essential (primary) hypertension: Secondary | ICD-10-CM | POA: Diagnosis not present

## 2021-12-14 DIAGNOSIS — M545 Low back pain, unspecified: Secondary | ICD-10-CM | POA: Diagnosis not present

## 2021-12-22 DIAGNOSIS — E875 Hyperkalemia: Secondary | ICD-10-CM | POA: Diagnosis not present

## 2021-12-30 DIAGNOSIS — Z86010 Personal history of colonic polyps: Secondary | ICD-10-CM | POA: Diagnosis not present

## 2021-12-30 DIAGNOSIS — D123 Benign neoplasm of transverse colon: Secondary | ICD-10-CM | POA: Diagnosis not present

## 2021-12-30 DIAGNOSIS — D128 Benign neoplasm of rectum: Secondary | ICD-10-CM | POA: Diagnosis not present

## 2021-12-30 DIAGNOSIS — K648 Other hemorrhoids: Secondary | ICD-10-CM | POA: Diagnosis not present

## 2022-01-02 DIAGNOSIS — M5451 Vertebrogenic low back pain: Secondary | ICD-10-CM | POA: Diagnosis not present

## 2022-01-02 DIAGNOSIS — M545 Low back pain, unspecified: Secondary | ICD-10-CM | POA: Insufficient documentation

## 2022-01-02 DIAGNOSIS — I700 Atherosclerosis of aorta: Secondary | ICD-10-CM | POA: Insufficient documentation

## 2022-01-02 DIAGNOSIS — E785 Hyperlipidemia, unspecified: Secondary | ICD-10-CM | POA: Diagnosis not present

## 2022-01-02 DIAGNOSIS — M5459 Other low back pain: Secondary | ICD-10-CM | POA: Diagnosis not present

## 2022-01-03 DIAGNOSIS — D128 Benign neoplasm of rectum: Secondary | ICD-10-CM | POA: Diagnosis not present

## 2022-01-03 DIAGNOSIS — D123 Benign neoplasm of transverse colon: Secondary | ICD-10-CM | POA: Diagnosis not present

## 2022-01-10 DIAGNOSIS — M5459 Other low back pain: Secondary | ICD-10-CM | POA: Diagnosis not present

## 2022-02-27 DIAGNOSIS — M5416 Radiculopathy, lumbar region: Secondary | ICD-10-CM | POA: Insufficient documentation

## 2022-02-27 DIAGNOSIS — M25552 Pain in left hip: Secondary | ICD-10-CM | POA: Diagnosis not present

## 2022-03-15 DIAGNOSIS — M5459 Other low back pain: Secondary | ICD-10-CM | POA: Diagnosis not present

## 2022-04-06 DIAGNOSIS — M5416 Radiculopathy, lumbar region: Secondary | ICD-10-CM | POA: Diagnosis not present

## 2022-05-02 DIAGNOSIS — M5451 Vertebrogenic low back pain: Secondary | ICD-10-CM | POA: Diagnosis not present

## 2022-05-11 DIAGNOSIS — E11319 Type 2 diabetes mellitus with unspecified diabetic retinopathy without macular edema: Secondary | ICD-10-CM | POA: Diagnosis not present

## 2022-05-11 DIAGNOSIS — M5416 Radiculopathy, lumbar region: Secondary | ICD-10-CM | POA: Diagnosis not present

## 2022-05-11 DIAGNOSIS — F430 Acute stress reaction: Secondary | ICD-10-CM | POA: Diagnosis not present

## 2022-06-08 DIAGNOSIS — M5440 Lumbago with sciatica, unspecified side: Secondary | ICD-10-CM | POA: Diagnosis not present

## 2022-06-12 DIAGNOSIS — B0052 Herpesviral keratitis: Secondary | ICD-10-CM | POA: Diagnosis not present

## 2022-06-12 DIAGNOSIS — H40013 Open angle with borderline findings, low risk, bilateral: Secondary | ICD-10-CM | POA: Diagnosis not present

## 2022-06-20 DIAGNOSIS — N281 Cyst of kidney, acquired: Secondary | ICD-10-CM | POA: Diagnosis not present

## 2022-06-20 DIAGNOSIS — C672 Malignant neoplasm of lateral wall of bladder: Secondary | ICD-10-CM | POA: Diagnosis not present

## 2022-07-04 DIAGNOSIS — N281 Cyst of kidney, acquired: Secondary | ICD-10-CM | POA: Diagnosis not present

## 2022-07-04 DIAGNOSIS — C679 Malignant neoplasm of bladder, unspecified: Secondary | ICD-10-CM | POA: Diagnosis not present

## 2022-07-04 DIAGNOSIS — C672 Malignant neoplasm of lateral wall of bladder: Secondary | ICD-10-CM | POA: Diagnosis not present

## 2022-07-04 DIAGNOSIS — N289 Disorder of kidney and ureter, unspecified: Secondary | ICD-10-CM | POA: Diagnosis not present

## 2022-07-11 DIAGNOSIS — M5416 Radiculopathy, lumbar region: Secondary | ICD-10-CM | POA: Diagnosis not present

## 2022-07-17 ENCOUNTER — Ambulatory Visit: Payer: Medicare HMO | Admitting: Podiatry

## 2022-07-17 DIAGNOSIS — M79674 Pain in right toe(s): Secondary | ICD-10-CM | POA: Diagnosis not present

## 2022-07-17 DIAGNOSIS — E119 Type 2 diabetes mellitus without complications: Secondary | ICD-10-CM

## 2022-07-17 DIAGNOSIS — M79675 Pain in left toe(s): Secondary | ICD-10-CM | POA: Diagnosis not present

## 2022-07-17 DIAGNOSIS — C670 Malignant neoplasm of trigone of bladder: Secondary | ICD-10-CM | POA: Diagnosis not present

## 2022-07-17 DIAGNOSIS — E1149 Type 2 diabetes mellitus with other diabetic neurological complication: Secondary | ICD-10-CM

## 2022-07-17 DIAGNOSIS — N281 Cyst of kidney, acquired: Secondary | ICD-10-CM | POA: Diagnosis not present

## 2022-07-17 DIAGNOSIS — T148XXA Other injury of unspecified body region, initial encounter: Secondary | ICD-10-CM

## 2022-07-17 DIAGNOSIS — B351 Tinea unguium: Secondary | ICD-10-CM

## 2022-07-17 NOTE — Progress Notes (Unsigned)
Subjective: 71 year old male presents the office today for diabetic foot evaluation.  He gets blisters on his feet at times. Prior he started soaking in epsom salts and the blisters started about a week after. Prvious drainage. Keeping gauze on them.   L Denies any ulcerations.  Continues to get calluses.  Denies any claudication symptoms.  He said chronic numbness to his big toes does not worsen.  No burning.  Pain does not remember night.  No weakness or falls.   Objective: AAO x3, NAD DP/PT pulses palpable bilaterally, CRT less than 3 seconds Semmes-Weinstein monofilament decreased to the hallux bilaterally but no other areas of decreased sensation Hyperkeratotic lesions right hallux, submetatarsal 5 bilaterally.  No ongoing ulcerations.  Nails are mildly hypertrophic and dystrophic without any significant pain. No pain with calf compression, swelling, warmth, erythema  Assessment: 71 year old male with type 2 diabetes with neuropathy  Plan: -All treatment options discussed with the patient including all alternatives, risks, complications.  -Neuropathy symptoms remain stable.  Still tingling- consider gabapentin but wants to hold off now -dry callus from blisters, moisturizer  We will continue to monitor.  As courtesy debrided the callus of the nails with any complications or bleeding.  Discussed daily foot inspection.  Return in about 1 year (around 04/28/2022).  Trula Slade DPM

## 2022-07-17 NOTE — Patient Instructions (Signed)
Diabetes Mellitus and Foot Care Foot care is an important part of your health, especially when you have diabetes. Diabetes may cause you to have problems because of poor blood flow (circulation) to your feet and legs, which can cause your skin to: Become thinner and drier. Break more easily. Heal more slowly. Peel and crack. You may also have nerve damage (neuropathy) in your legs and feet, causing decreased feeling in them. This means that you may not notice minor injuries to your feet that could lead to more serious problems. Noticing and addressing any potential problems early is the best way to prevent future foot problems. How to care for your feet Foot hygiene  Wash your feet daily with warm water and mild soap. Do not use hot water. Then, pat your feet and the areas between your toes until they are completely dry. Do not soak your feet as this can dry your skin. Trim your toenails straight across. Do not dig under them or around the cuticle. File the edges of your nails with an emery board or nail file. Apply a moisturizing lotion or petroleum jelly to the skin on your feet and to dry, brittle toenails. Use lotion that does not contain alcohol and is unscented. Do not apply lotion between your toes. Shoes and socks Wear clean socks or stockings every day. Make sure they are not too tight. Do not wear knee-high stockings since they may decrease blood flow to your legs. Wear shoes that fit properly and have enough cushioning. Always look in your shoes before you put them on to be sure there are no objects inside. To break in new shoes, wear them for just a few hours a day. This prevents injuries on your feet. Wounds, scrapes, corns, and calluses  Check your feet daily for blisters, cuts, bruises, sores, and redness. If you cannot see the bottom of your feet, use a mirror or ask someone for help. Do not cut corns or calluses or try to remove them with medicine. If you find a minor scrape,  cut, or break in the skin on your feet, keep it and the skin around it clean and dry. You may clean these areas with mild soap and water. Do not clean the area with peroxide, alcohol, or iodine. If you have a wound, scrape, corn, or callus on your foot, look at it several times a day to make sure it is healing and not infected. Check for: Redness, swelling, or pain. Fluid or blood. Warmth. Pus or a bad smell. General tips Do not cross your legs. This may decrease blood flow to your feet. Do not use heating pads or hot water bottles on your feet. They may burn your skin. If you have lost feeling in your feet or legs, you may not know this is happening until it is too late. Protect your feet from hot and cold by wearing shoes, such as at the beach or on hot pavement. Schedule a complete foot exam at least once a year (annually) or more often if you have foot problems. Report any cuts, sores, or bruises to your health care provider immediately. Where to find more information American Diabetes Association: www.diabetes.org Association of Diabetes Care & Education Specialists: www.diabeteseducator.org Contact a health care provider if: You have a medical condition that increases your risk of infection and you have any cuts, sores, or bruises on your feet. You have an injury that is not healing. You have redness on your legs or feet. You   feel burning or tingling in your legs or feet. You have pain or cramps in your legs and feet. Your legs or feet are numb. Your feet always feel cold. You have pain around any toenails. Get help right away if: You have a wound, scrape, corn, or callus on your foot and: You have pain, swelling, or redness that gets worse. You have fluid or blood coming from the wound, scrape, corn, or callus. Your wound, scrape, corn, or callus feels warm to the touch. You have pus or a bad smell coming from the wound, scrape, corn, or callus. You have a fever. You have a red  line going up your leg. Summary Check your feet every day for blisters, cuts, bruises, sores, and redness. Apply a moisturizing lotion or petroleum jelly to the skin on your feet and to dry, brittle toenails. Wear shoes that fit properly and have enough cushioning. If you have foot problems, report any cuts, sores, or bruises to your health care provider immediately. Schedule a complete foot exam at least once a year (annually) or more often if you have foot problems. This information is not intended to replace advice given to you by your health care provider. Make sure you discuss any questions you have with your health care provider. Document Revised: 06/03/2020 Document Reviewed: 06/03/2020 Elsevier Patient Education  2023 Elsevier Inc.  

## 2022-07-21 ENCOUNTER — Ambulatory Visit: Payer: Medicare HMO | Admitting: Podiatry

## 2022-08-08 DIAGNOSIS — M5440 Lumbago with sciatica, unspecified side: Secondary | ICD-10-CM | POA: Diagnosis not present

## 2022-09-11 DIAGNOSIS — M5440 Lumbago with sciatica, unspecified side: Secondary | ICD-10-CM | POA: Diagnosis not present

## 2022-09-12 IMAGING — XA DG FLUORO GUIDE NDL PLC/BX
1 series · 1 of 1 positions shown · non-contrast
Comparison: none

CLINICAL DATA: Pain

EXAM:
LEFT HIP INJECTION UNDER FLUOROSCOPY
FLUOROSCOPY TIME:  11 seconds; 11 mGy
TECHNIQUE: The procedure, risks (including but not limited to bleeding,
infection, organ damage ), benefits, and alternatives were explained
to the patient. Questions regarding the procedure were encouraged
and answered. The patient understands and consents to the procedure.

[Series 1: ortho standard · 1 of 1 slices shown]
[im 1/1]
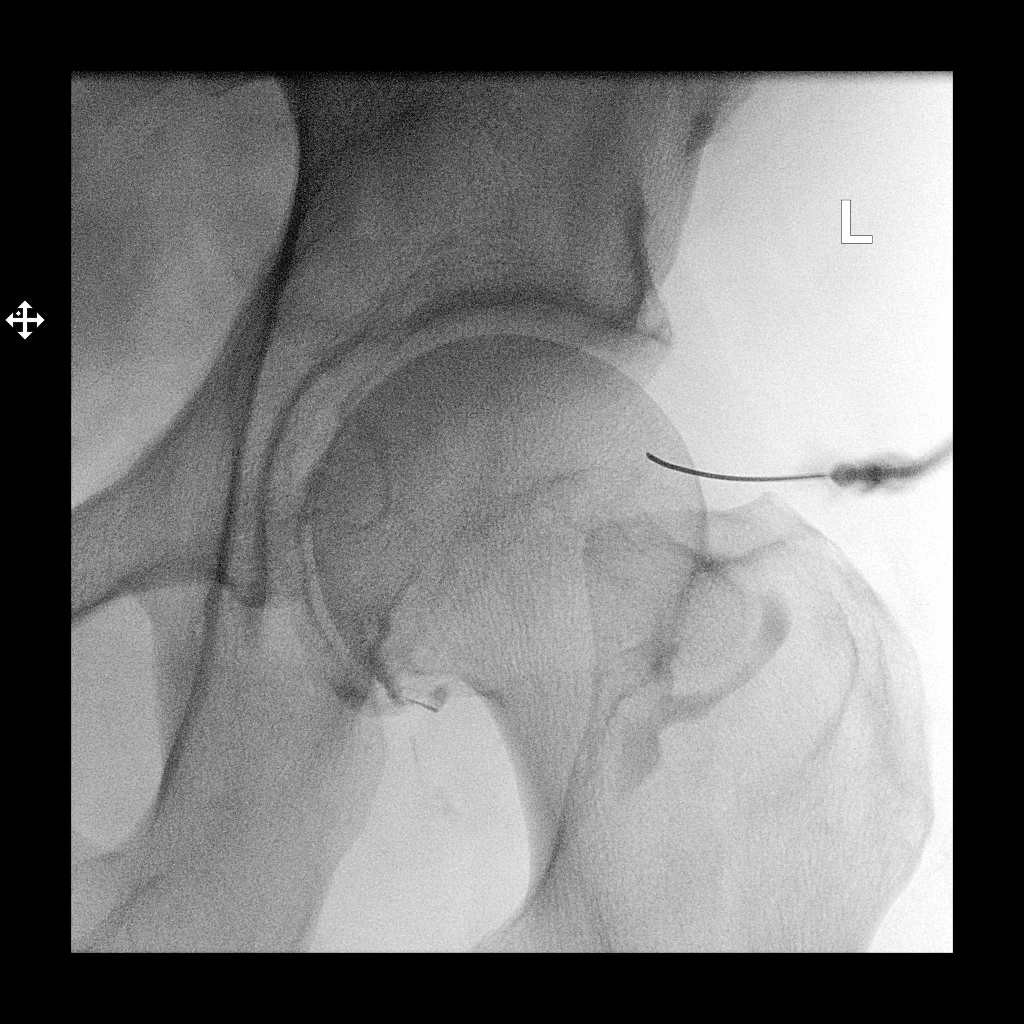

[1 of 1 positions shown; findings below may reference images not displayed]

An appropriate skin entry site was determined under fluoroscopy.
Site was marked, prepped with Betadine, draped in usual sterile
fashion, infiltrated locally with 1% lidocaine. A 22 gauge spinal
needle was advanced to the superior lateral margin of the femoral
head. 1 ml bupivacaine 0.5 % injected easily. Contrast injection of
2 ml showed intraarticular spread without any intravascular
component. 80 mg Depo-Medrol and 5 ml bupivacaine 0.5% was
administered. The patient tolerated procedure well.

COMPLICATIONS:
None immediate
IMPRESSION: 1. Technically successful left hip injection under fluoroscopy

## 2022-09-20 DIAGNOSIS — M5440 Lumbago with sciatica, unspecified side: Secondary | ICD-10-CM | POA: Diagnosis not present

## 2022-09-22 DIAGNOSIS — Z0000 Encounter for general adult medical examination without abnormal findings: Secondary | ICD-10-CM | POA: Diagnosis not present

## 2022-09-22 DIAGNOSIS — Z6828 Body mass index (BMI) 28.0-28.9, adult: Secondary | ICD-10-CM | POA: Diagnosis not present

## 2022-09-22 DIAGNOSIS — Z23 Encounter for immunization: Secondary | ICD-10-CM | POA: Diagnosis not present

## 2022-10-04 DIAGNOSIS — M5440 Lumbago with sciatica, unspecified side: Secondary | ICD-10-CM | POA: Diagnosis not present

## 2022-10-11 DIAGNOSIS — M5440 Lumbago with sciatica, unspecified side: Secondary | ICD-10-CM | POA: Diagnosis not present

## 2022-10-31 DIAGNOSIS — M5440 Lumbago with sciatica, unspecified side: Secondary | ICD-10-CM | POA: Diagnosis not present

## 2022-12-10 IMAGING — US US ABDOMINAL AORTA SCREENING AAA
1 series · 7 of 7 positions shown · non-contrast
Comparison: None.

CLINICAL DATA: Male between 65-75 years of age with a smoking
history.

EXAM:
US ABDOMINAL AORTA MEDICARE SCREENING
TECHNIQUE: Ultrasound examination of the abdominal aorta was performed as a
screening evaluation for abdominal aortic aneurysm.

[Series 1: us abdominal aorta screening aaa · 0.28mm/px · 7 of 7 slices shown]
[im 1/7]
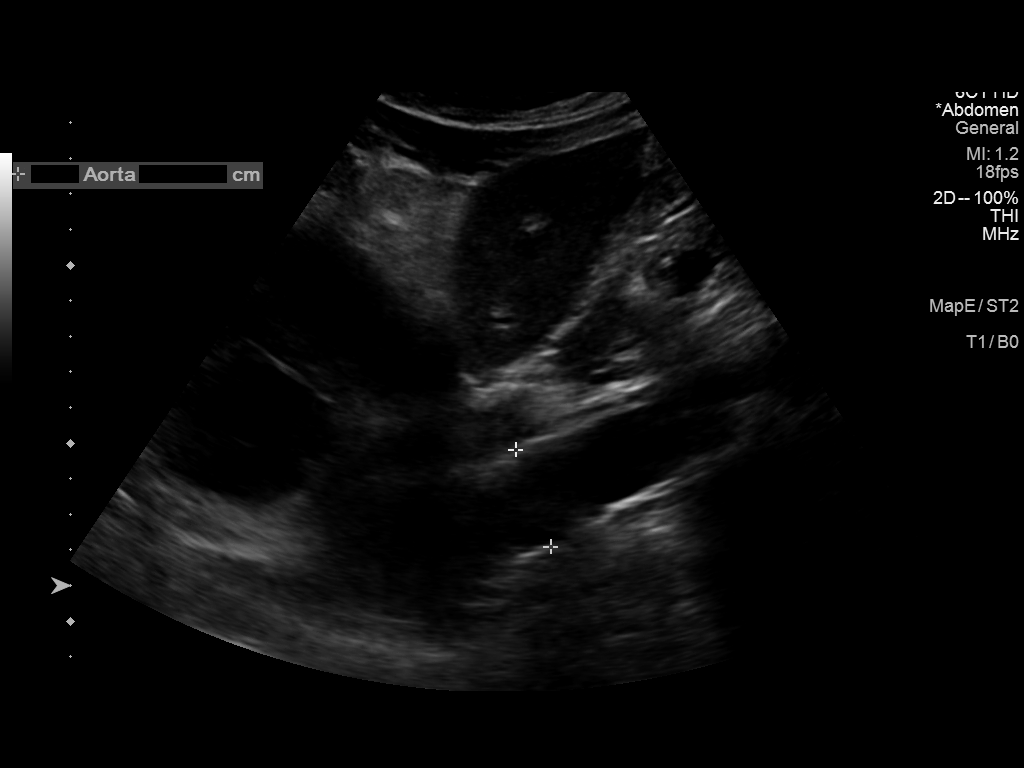
[im 2/7]
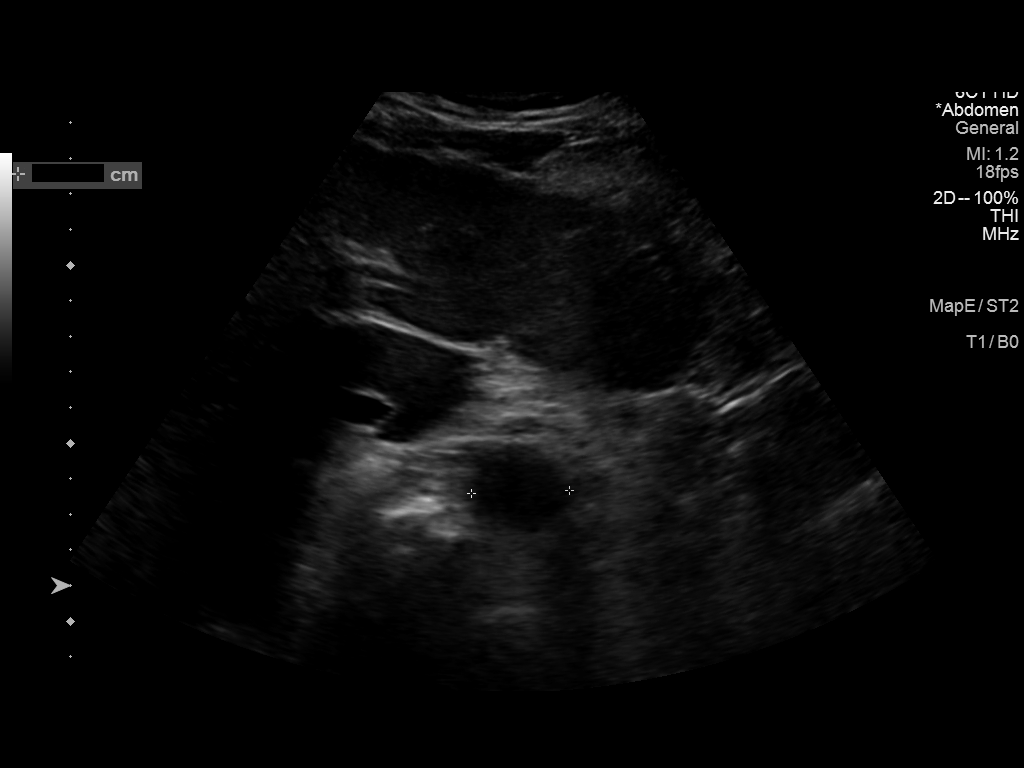
[im 3/7]
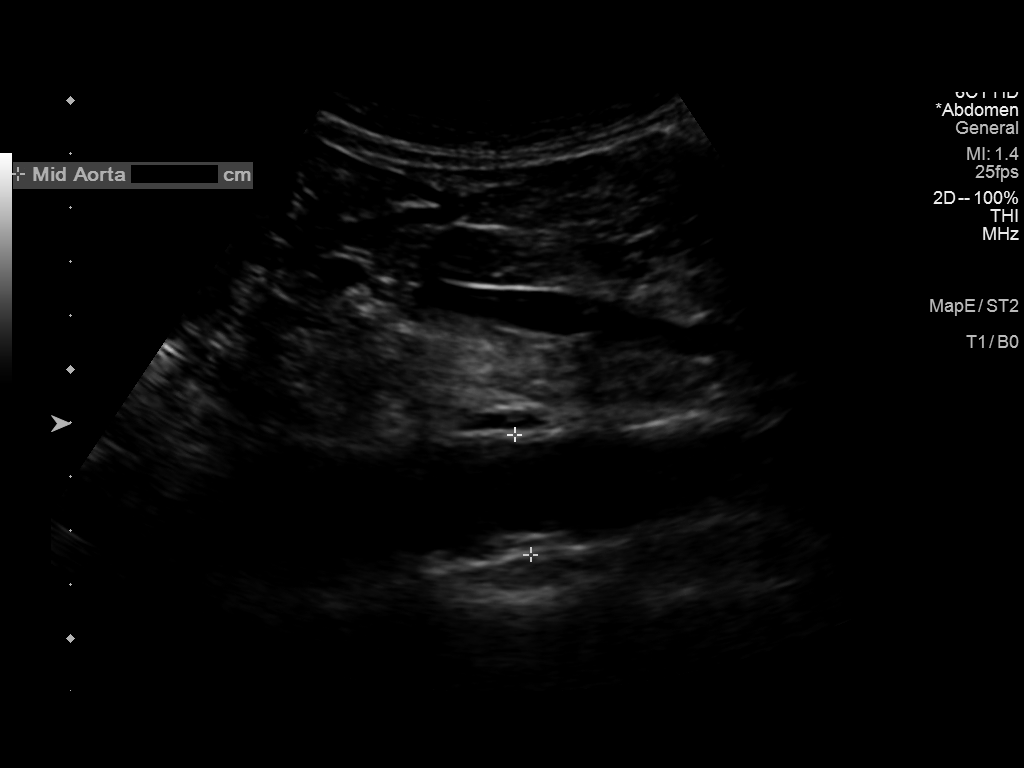
[im 4/7]
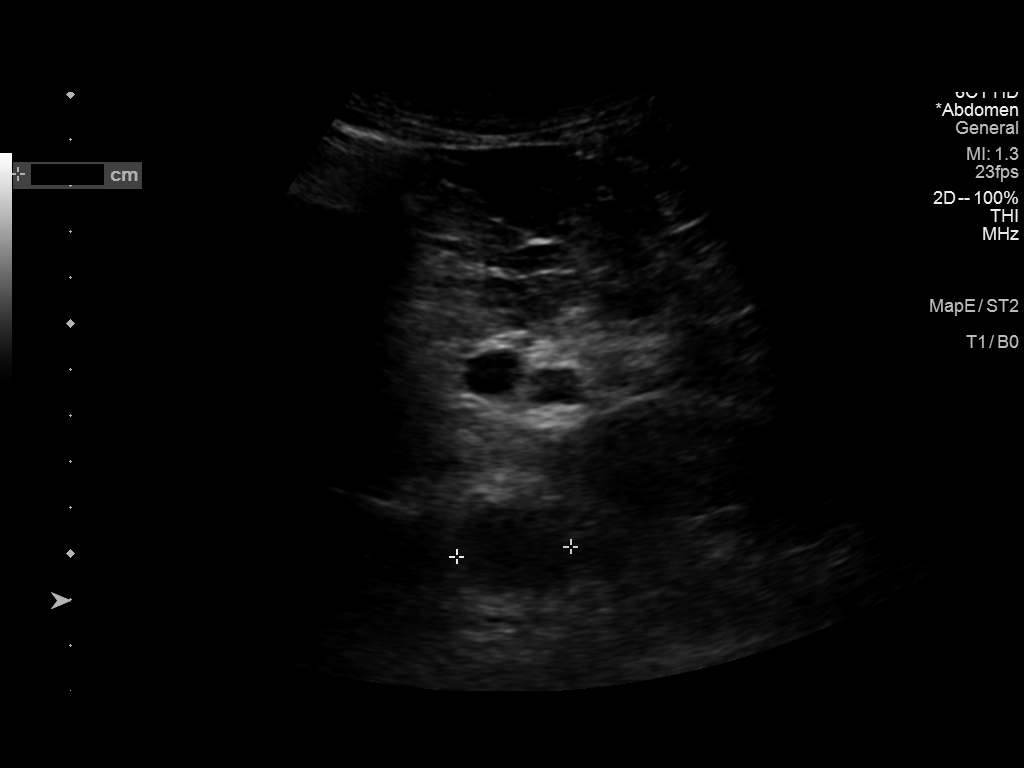
[im 5/7]
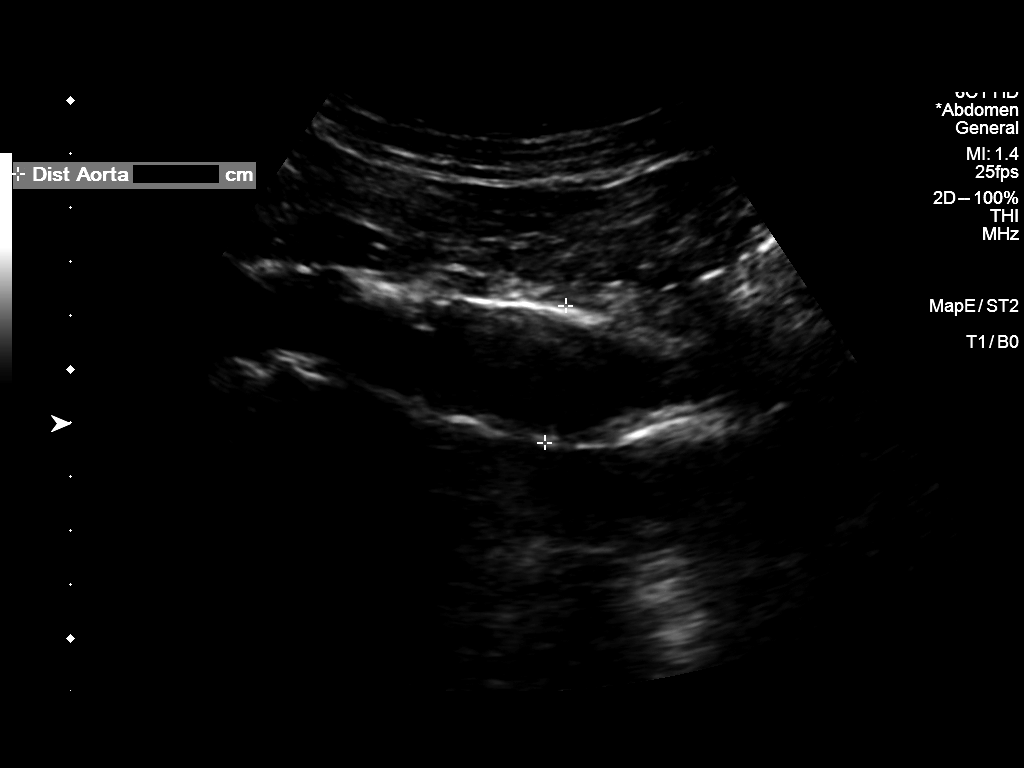
[im 6/7]
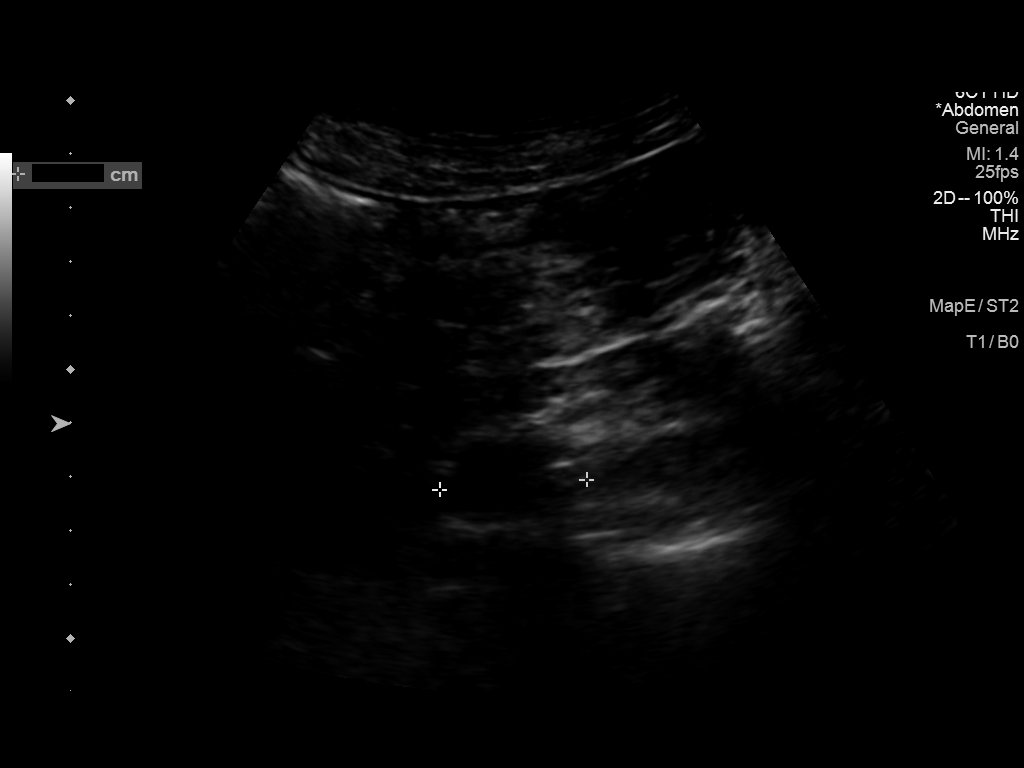
[im 7/7]
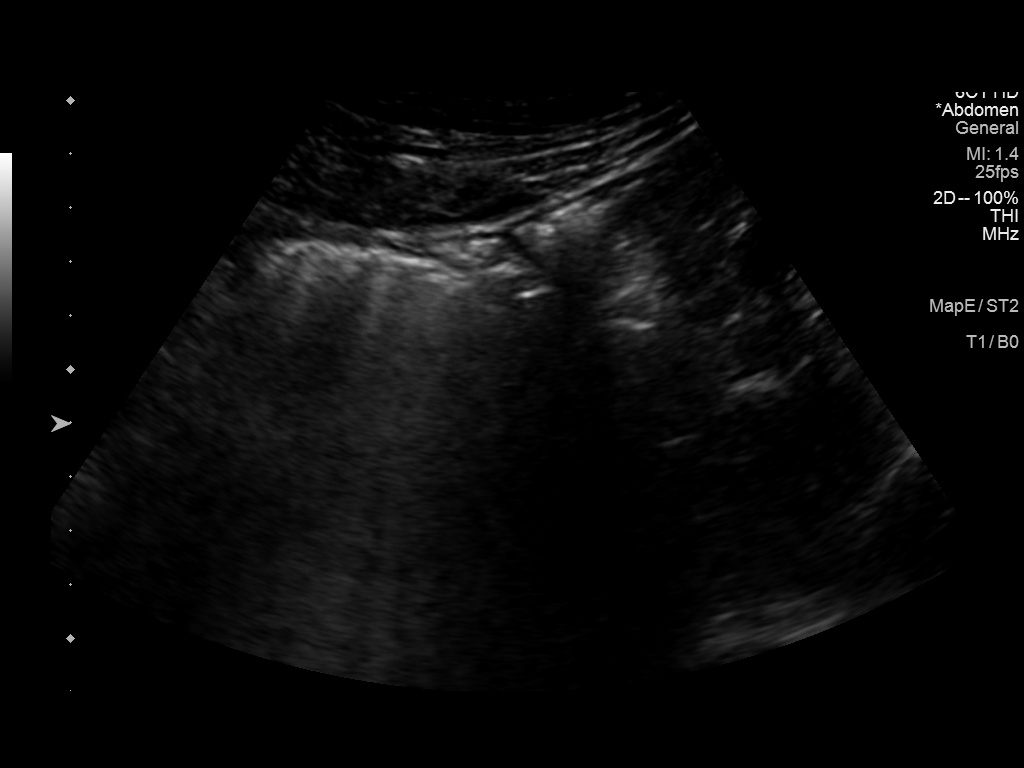

[7 of 7 positions shown; findings below may reference images not displayed]

FINDINGS: Abdominal aortic measurements as follows:

Proximal:  2.8 x 2.9 cm

Mid:  2.3 x 2.5 cm

Distal:  2.6 x 2.7 cm

Atherosclerotic plaque visualized throughout the abdominal aorta.
IMPRESSION: Atherosclerosis of the abdominal aorta without evidence of
aneurysmal disease.

## 2022-12-19 DIAGNOSIS — H04123 Dry eye syndrome of bilateral lacrimal glands: Secondary | ICD-10-CM | POA: Diagnosis not present

## 2022-12-19 DIAGNOSIS — H21233 Degeneration of iris (pigmentary), bilateral: Secondary | ICD-10-CM | POA: Diagnosis not present

## 2022-12-19 DIAGNOSIS — H40013 Open angle with borderline findings, low risk, bilateral: Secondary | ICD-10-CM | POA: Diagnosis not present

## 2022-12-19 DIAGNOSIS — E119 Type 2 diabetes mellitus without complications: Secondary | ICD-10-CM | POA: Diagnosis not present

## 2023-05-03 DIAGNOSIS — I10 Essential (primary) hypertension: Secondary | ICD-10-CM | POA: Diagnosis not present

## 2023-05-03 DIAGNOSIS — E7800 Pure hypercholesterolemia, unspecified: Secondary | ICD-10-CM | POA: Diagnosis not present

## 2023-05-03 DIAGNOSIS — E11319 Type 2 diabetes mellitus with unspecified diabetic retinopathy without macular edema: Secondary | ICD-10-CM | POA: Diagnosis not present

## 2023-05-17 DIAGNOSIS — E119 Type 2 diabetes mellitus without complications: Secondary | ICD-10-CM | POA: Diagnosis not present

## 2023-05-17 DIAGNOSIS — E7800 Pure hypercholesterolemia, unspecified: Secondary | ICD-10-CM | POA: Diagnosis not present

## 2023-05-17 DIAGNOSIS — Z6828 Body mass index (BMI) 28.0-28.9, adult: Secondary | ICD-10-CM | POA: Diagnosis not present

## 2023-05-17 DIAGNOSIS — Z794 Long term (current) use of insulin: Secondary | ICD-10-CM | POA: Diagnosis not present

## 2023-05-17 DIAGNOSIS — Z0000 Encounter for general adult medical examination without abnormal findings: Secondary | ICD-10-CM | POA: Diagnosis not present

## 2023-05-17 DIAGNOSIS — Z125 Encounter for screening for malignant neoplasm of prostate: Secondary | ICD-10-CM | POA: Diagnosis not present

## 2023-05-17 DIAGNOSIS — I10 Essential (primary) hypertension: Secondary | ICD-10-CM | POA: Diagnosis not present

## 2023-05-17 DIAGNOSIS — I700 Atherosclerosis of aorta: Secondary | ICD-10-CM | POA: Diagnosis not present

## 2023-05-17 DIAGNOSIS — J439 Emphysema, unspecified: Secondary | ICD-10-CM | POA: Diagnosis not present

## 2023-06-19 DIAGNOSIS — H401121 Primary open-angle glaucoma, left eye, mild stage: Secondary | ICD-10-CM | POA: Diagnosis not present

## 2023-06-19 DIAGNOSIS — H40011 Open angle with borderline findings, low risk, right eye: Secondary | ICD-10-CM | POA: Diagnosis not present

## 2023-06-19 DIAGNOSIS — H21233 Degeneration of iris (pigmentary), bilateral: Secondary | ICD-10-CM | POA: Diagnosis not present

## 2023-07-09 ENCOUNTER — Ambulatory Visit: Payer: Medicare HMO | Admitting: Podiatry

## 2023-07-09 ENCOUNTER — Encounter: Payer: Self-pay | Admitting: Podiatry

## 2023-07-09 VITALS — BP 147/79 | HR 85

## 2023-07-09 DIAGNOSIS — M79675 Pain in left toe(s): Secondary | ICD-10-CM | POA: Diagnosis not present

## 2023-07-09 DIAGNOSIS — E1149 Type 2 diabetes mellitus with other diabetic neurological complication: Secondary | ICD-10-CM

## 2023-07-09 DIAGNOSIS — M79674 Pain in right toe(s): Secondary | ICD-10-CM

## 2023-07-09 DIAGNOSIS — B351 Tinea unguium: Secondary | ICD-10-CM | POA: Diagnosis not present

## 2023-07-09 DIAGNOSIS — E119 Type 2 diabetes mellitus without complications: Secondary | ICD-10-CM

## 2023-07-09 NOTE — Progress Notes (Signed)
Subjective: Chief Complaint  Patient presents with   Nail Problem    Here for diabetic foot yearly exam wants toenails trimmed   72 year old male presents the office today for diabetic foot evaluation.  He has been doing well, no concerns otherwise. No open lesions.   Objective: AAO x3, NAD DP/PT pulses palpable bilaterally, CRT less than 3 seconds Semmes-Weinstein monofilament decreased to the hallux bilaterally but stable Nails are elongated mildly dystrophic with yellow discoloration.  No edema, erythema or signs of infection.  No open lesions. No pain with calf compression, swelling, warmth, erythema  Assessment: 72 year old male with type 2 diabetes with neuropathy; symptomatic onychomycosis  Plan: -All treatment options discussed with the patient including all alternatives, risks, complications.  -Neuropathy symptoms remain stable.  Continue to monitor symptoms. -Sharply debrided nails x10 without any complications or bleeding.  Return in about 1 year (around 07/08/2024) for diabetic foot exam.   Vivi Barrack DPM

## 2023-07-23 DIAGNOSIS — C670 Malignant neoplasm of trigone of bladder: Secondary | ICD-10-CM | POA: Diagnosis not present

## 2023-07-26 ENCOUNTER — Ambulatory Visit: Payer: Medicare HMO | Admitting: Pulmonary Disease

## 2023-07-26 ENCOUNTER — Encounter: Payer: Self-pay | Admitting: Pulmonary Disease

## 2023-07-26 VITALS — BP 118/76 | HR 83 | Ht 68.0 in | Wt 160.8 lb

## 2023-07-26 DIAGNOSIS — F17210 Nicotine dependence, cigarettes, uncomplicated: Secondary | ICD-10-CM | POA: Diagnosis not present

## 2023-07-26 DIAGNOSIS — R0609 Other forms of dyspnea: Secondary | ICD-10-CM

## 2023-07-26 MED ORDER — STIOLTO RESPIMAT 2.5-2.5 MCG/ACT IN AERS: 2.0000 | INHALATION_SPRAY | Freq: Every day | RESPIRATORY_TRACT | 11 refills | Status: AC

## 2023-07-26 NOTE — Patient Instructions (Addendum)
Nice to meet you  Try Stiolto 2 puffs once a day  Lets see if this helps open up the airways and makes it easier to get upstairs go hunting do activities etc.  If this is too expensive please send me a message and let me know  Will get pulmonary function test when you return for your next visit so we can better understand your shortness of breath and hopefully arrive with a accurate diagnosis  Return to clinic in 3 months with pulmonary function test same day as follow-up visit with Dr. Judeth Horn

## 2023-07-26 NOTE — Progress Notes (Signed)
@Patient  ID: Robert Garner, male    DOB: 01-13-1951, 72 y.o.   MRN: [id number]  Chief Complaint  Patient presents with   Consult    Emphysema ,     Referring provider: Daisy Floro, MD  HPI:   72 y.o. man whom we are seeing for evaluation of dyspnea on exertion.  PCP note reviewed.  Prior cardiothoracic surgery notes reviewed.  Patient presents to clinic with referral for PCP.  Not totally sure why he is here.  Does admit to some dyspnea on exertion.  When he has to run across the field having cavities.  Sometimes going up inclines or stairs.  Present for the last year or 2 more noticeable over time.  No times a day when things are better or worse.  No positional making is better or worse.  No environmental factors he can evaluate things better or worse.  No alleviating or exacerbating factors.  Most recent chest imaging 2015 CT scan demonstrates emphysema and left upper lobe nodular opacity.  He went for a biopsy of the mass.  This was benign on review of pathology.  He had a subsequent PET scan after the CT scan showed decrease in size over the course of 2 weeks likely inflammatory in nature.  He states he had recently had a pneumonia.  He continues to smoke.  Precontemplative.  Questionaires / Pulmonary Flowsheets:   ACT:      No data to display          MMRC:     No data to display          Epworth:      No data to display          Tests:   FENO:  No results found for: "NITRICOXIDE"  PFT:     No data to display          WALK:      No data to display          Imaging: Personally reviewed and as per EMR discussion this note No results found.  Lab Results: Personally reviewed CBC    Component Value Date/Time   WBC 8.9 03/02/2014 0856   RBC 4.87 03/02/2014 0856   HGB 17.7 (H) 03/02/2014 0856   HCT 49.8 03/02/2014 0856   PLT 216 03/02/2014 0856   MCV 102.3 (H) 03/02/2014 0856   MCH 36.3 (H) 03/02/2014 0856   MCHC 35.5  03/02/2014 0856   RDW 13.9 03/02/2014 0856    BMET    Component Value Date/Time   NA 141 03/02/2014 0856   K 4.6 03/02/2014 0856   CL 100 03/02/2014 0856   CO2 27 03/02/2014 0856   GLUCOSE 118 (H) 03/02/2014 0856   BUN 17 03/02/2014 0856   CREATININE 1.04 03/02/2014 0856   CALCIUM 9.0 03/02/2014 0856   GFRNONAA 76 (L) 03/02/2014 0856   GFRAA 88 (L) 03/02/2014 0856    BNP No results found for: "BNP"  ProBNP No results found for: "PROBNP"  Specialty Problems       Pulmonary Problems   Lung infiltrate    Allergies  Allergen Reactions   Other Itching   Hydrocodone Itching     There is no immunization history on file for this patient.  Past Medical History:  Diagnosis Date   Anxiety    Diabetes mellitus without complication (HCC)    Keratitis sicca, right eye (HCC)    Pneumonia 10/2013   Seasonal allergies  Tobacco History: Social History   Tobacco Use  Smoking Status Every Day   Current packs/day: 1.00   Average packs/day: 1 pack/day for 40.0 years (40.0 ttl pk-yrs)   Types: Cigarettes  Smokeless Tobacco Not on file   Ready to quit: Not Answered Counseling given: Not Answered   Continue to not smoke  Outpatient Encounter Medications as of 07/26/2023  Medication Sig   irbesartan (AVAPRO) 150 MG tablet Take 150 mg by mouth daily.   LORazepam (ATIVAN) 0.5 MG tablet    metFORMIN (GLUCOPHAGE-XR) 500 MG 24 hr tablet    rosuvastatin (CRESTOR) 20 MG tablet    Tiotropium Bromide-Olodaterol (STIOLTO RESPIMAT) 2.5-2.5 MCG/ACT AERS Inhale 2 puffs into the lungs daily.   TRESIBA FLEXTOUCH 100 UNIT/ML FlexTouch Pen    valACYclovir (VALTREX) 1000 MG tablet Take 1,000 mg by mouth daily.   aspirin 81 MG EC tablet Take 81 mg by mouth daily. Swallow whole. (Patient not taking: Reported on 07/09/2023)   BIOTIN PO Take 40 mg by mouth daily. (Patient not taking: Reported on 07/09/2023)   buPROPion (WELLBUTRIN SR) 100 MG 12 hr tablet TAKE 1 TABLET BY MOUTH EVERY  MORNING TWICE A DAY (Patient not taking: Reported on 07/09/2023)   Calcium-Vitamin D (CALTRATE 600 PLUS-VIT D PO) Take by mouth daily. (Patient not taking: Reported on 07/09/2023)   celecoxib (CELEBREX) 200 MG capsule Take by mouth. (Patient not taking: Reported on 07/09/2023)   ciclopirox (PENLAC) 8 % solution Apply topically at bedtime. Apply over nail and surrounding skin. Apply daily over previous coat. After seven (7) days, may remove with alcohol and continue cycle. (Patient not taking: Reported on 07/09/2023)   Cranberry 450 MG TABS Take 900 mg by mouth daily. (Patient not taking: Reported on 07/09/2023)   diazepam (VALIUM) 2 MG tablet  (Patient not taking: Reported on 07/09/2023)   montelukast (SINGULAIR) 10 MG tablet Take 10 mg by mouth daily as needed (allergies).  (Patient not taking: Reported on 07/09/2023)   Multiple Vitamin (MULTIVITAMIN) capsule Take 1 capsule by mouth daily. (Patient not taking: Reported on 07/09/2023)   nicotine (NICODERM CQ - DOSED IN MG/24 HOURS) 14 mg/24hr patch PLACE 1 PATCH ONTO SKIN EVERY DAY (Patient not taking: Reported on 07/09/2023)   NOVOFINE 32G X 6 MM MISC  (Patient not taking: Reported on 07/09/2023)   Omega-3 Fatty Acids (FISH OIL PO) Take 2 capsules by mouth 2 (two) times daily. (Patient not taking: Reported on 07/09/2023)   OVER THE COUNTER MEDICATION Take 1 tablet by mouth daily. Super B Energy Complex vitamin (Patient not taking: Reported on 07/09/2023)   Probiotic Product (SOLUBLE FIBER/PROBIOTICS PO) Take 1 capsule by mouth daily. (Patient not taking: Reported on 07/09/2023)   sertraline (ZOLOFT) 50 MG tablet Take 50 mg by mouth daily.  (Patient not taking: Reported on 07/09/2023)   SYSTANE ULTRA 0.4-0.3 % SOLN SMARTSIG:1 Drop(s) In Eye(s) PRN (Patient not taking: Reported on 07/09/2023)   traMADol (ULTRAM) 50 MG tablet 1-2 tabs po q6 hours prn pain (Patient not taking: Reported on 07/09/2023)   vitamin E 400 UNIT capsule Take 400 Units by mouth daily. (Patient  not taking: Reported on 07/09/2023)   No facility-administered encounter medications on file as of 07/26/2023.     Review of Systems  Review of Systems  No chest pain exertion.  No orthopnea or PND.  Comprehensive review of systems otherwise negative. Physical Exam  BP 118/76   Pulse 83   Ht 5\' 8"  (1.727 m)   Wt 160 lb 12.8  oz (72.9 kg)   SpO2 96%   BMI 24.45 kg/m   Wt Readings from Last 5 Encounters:  07/26/23 160 lb 12.8 oz (72.9 kg)  05/14/20 157 lb (71.2 kg)  05/04/16 192 lb (87.1 kg)  08/04/14 193 lb (87.5 kg)  04/28/14 193 lb (87.5 kg)    BMI Readings from Last 5 Encounters:  07/26/23 24.45 kg/m  05/14/20 23.87 kg/m  05/04/16 29.19 kg/m  08/04/14 29.35 kg/m  04/28/14 29.35 kg/m     Physical Exam General: Sitting in chair, no distress Eyes: EOMI, active Neck: Supple, no JVD Pulmonary: Clear, distant Cardiovascular warm, no edema Abdomen:nondistended, bowel sounds sounds present Extremities: No synovitis, no joint effusion Neuro: No deficits, normal gait Psych: Normal mood, full affect   Assessment & Plan:   Dyspnea on exertion: Possibly related to aging, deconditioning.  Fairly significant emphysema on CT scan 2015 with ongoing cigarette use.  High suspicion for smoking-related lung disease.  PFTs for further evaluation.  Trial of Stiolto, samples and new prescription today.  Tobacco abuse: Smoking assessment and cessation counseling I have advised the patient to quit/stop smoking as soon as possible due to high risk for multiple medical problems.  It will also be very difficult for [place] to manage patient's  respiratory symptoms and status if we continue to expose her lungs to a known irritant.  We do not advise e-cigarettes as a form of stopping smoking. Patient is not willing to quit smoking. I have advised the patient that we can assist and have options of nicotine replacement therapy, provided smoking cessation education today, provided smoking  cessation counseling, and provided cessation resources. Follow-up next office visit office visit for assessment of smoking cessation.  I spent 30 minutes in tobacco cessation counseling.   Return in about 3 months (around 10/26/2023) for f/u Dr. Judeth Horn, after PFT.   Karren Burly, MD 07/26/2023   This appointment required 64 minutes of patient care (this includes precharting, chart review, review of results, face-to-face care, etc.).

## 2023-08-03 DIAGNOSIS — L570 Actinic keratosis: Secondary | ICD-10-CM | POA: Diagnosis not present

## 2023-08-03 DIAGNOSIS — B078 Other viral warts: Secondary | ICD-10-CM | POA: Diagnosis not present

## 2023-08-03 DIAGNOSIS — X32XXXA Exposure to sunlight, initial encounter: Secondary | ICD-10-CM | POA: Diagnosis not present

## 2023-10-02 DIAGNOSIS — Z23 Encounter for immunization: Secondary | ICD-10-CM | POA: Diagnosis not present

## 2023-10-02 DIAGNOSIS — Z6828 Body mass index (BMI) 28.0-28.9, adult: Secondary | ICD-10-CM | POA: Diagnosis not present

## 2023-10-02 DIAGNOSIS — Z0000 Encounter for general adult medical examination without abnormal findings: Secondary | ICD-10-CM | POA: Diagnosis not present

## 2023-10-02 DIAGNOSIS — Z1331 Encounter for screening for depression: Secondary | ICD-10-CM | POA: Diagnosis not present

## 2023-10-16 ENCOUNTER — Ambulatory Visit (HOSPITAL_BASED_OUTPATIENT_CLINIC_OR_DEPARTMENT_OTHER): Payer: Medicare HMO | Admitting: Pulmonary Disease

## 2023-10-16 ENCOUNTER — Ambulatory Visit: Payer: Medicare HMO | Admitting: Pulmonary Disease

## 2023-10-16 ENCOUNTER — Encounter: Payer: Self-pay | Admitting: Pulmonary Disease

## 2023-10-16 VITALS — BP 120/70 | HR 85 | Ht 67.0 in | Wt 185.0 lb

## 2023-10-16 DIAGNOSIS — R0600 Dyspnea, unspecified: Secondary | ICD-10-CM

## 2023-10-16 DIAGNOSIS — R0609 Other forms of dyspnea: Secondary | ICD-10-CM | POA: Diagnosis not present

## 2023-10-16 DIAGNOSIS — R053 Chronic cough: Secondary | ICD-10-CM

## 2023-10-16 LAB — PULMONARY FUNCTION TEST
DL/VA % pred: 60 %
DL/VA: 2.48 ml/min/mmHg/L
DLCO cor % pred: 68 %
DLCO cor: 16.28 ml/min/mmHg
DLCO unc % pred: 68 %
DLCO unc: 16.28 ml/min/mmHg
FEF 25-75 Post: 1.12 L/s
FEF 25-75 Pre: 1.15 L/s
FEF2575-%Change-Post: -2 %
FEF2575-%Pred-Post: 51 %
FEF2575-%Pred-Pre: 52 %
FEV1-%Change-Post: 0 %
FEV1-%Pred-Post: 84 %
FEV1-%Pred-Pre: 83 %
FEV1-Post: 2.44 L
FEV1-Pre: 2.42 L
FEV1FVC-%Change-Post: 0 %
FEV1FVC-%Pred-Pre: 81 %
FEV6-%Change-Post: 0 %
FEV6-%Pred-Post: 106 %
FEV6-%Pred-Pre: 106 %
FEV6-Post: 3.94 L
FEV6-Pre: 3.95 L
FEV6FVC-%Change-Post: 0 %
FEV6FVC-%Pred-Post: 103 %
FEV6FVC-%Pred-Pre: 104 %
FVC-%Change-Post: 0 %
FVC-%Pred-Post: 103 %
FVC-%Pred-Pre: 102 %
FVC-Post: 4.06 L
FVC-Pre: 4.04 L
Post FEV1/FVC ratio: 60 %
Post FEV6/FVC ratio: 97 %
Pre FEV1/FVC ratio: 60 %
Pre FEV6/FVC Ratio: 98 %
RV % pred: 103 %
RV: 2.37 L
TLC % pred: 107 %
TLC: 6.9 L

## 2023-10-16 NOTE — Progress Notes (Signed)
@Patient  ID: Robert Garner, male    DOB: 05/13/1951, 72 y.o.   MRN: [id number]  Chief Complaint  Patient presents with   Follow-up    PFT results     Referring provider: Daisy Floro, MD  HPI:   72 y.o. man whom we are seeing for evaluation of chronic cough.  Patient returns for routine follow-up.  Today he denies significant dyspnea.  Feel he do things well.  Activities well.  Runs distances putting coyotes etc.  Denies significant dyspnea.  It did for the last visit.  Does have cough.  Mucus production.  Some congestion.  Does continue to smoke.  PFTs performed today.  Reviewed in detail.  History of very mild COPD with FEV1 greater than 80%.  Moderate reduced DLCO, otherwise normal.  Has you Stiolto in the interim.  No improvement in the symptoms.  HPI initial visit: Patient presents to clinic with referral for PCP.  Not totally sure why he is here.  Does admit to some dyspnea on exertion.  When he has to run across the field having cavities.  Sometimes going up inclines or stairs.  Present for the last year or 2 more noticeable over time.  No times a day when things are better or worse.  No positional making is better or worse.  No environmental factors he can evaluate things better or worse.  No alleviating or exacerbating factors.  Most recent chest imaging 2015 CT scan demonstrates emphysema and left upper lobe nodular opacity.  He went for a biopsy of the mass.  This was benign on review of pathology.  He had a subsequent PET scan after the CT scan showed decrease in size over the course of 2 weeks likely inflammatory in nature.  He states he had recently had a pneumonia.  He continues to smoke.  Precontemplative.  Questionaires / Pulmonary Flowsheets:   ACT:      No data to display          MMRC:     No data to display          Epworth:      No data to display          Tests:   FENO:  No results found for: "NITRICOXIDE"  PFT:    Latest Ref  Rng & Units 10/16/2023   11:08 AM  PFT Results  FVC-Pre L 4.04  P  FVC-Predicted Pre % 102  P  FVC-Post L 4.06  P  FVC-Predicted Post % 103  P  Pre FEV1/FVC % % 60  P  Post FEV1/FCV % % 60  P  FEV1-Pre L 2.42  P  FEV1-Predicted Pre % 83  P  FEV1-Post L 2.44  P  DLCO uncorrected ml/min/mmHg 16.28  P  DLCO UNC% % 68  P  DLCO corrected ml/min/mmHg 16.28  P  DLCO COR %Predicted % 68  P  DLVA Predicted % 60  P  TLC L 6.90  P  TLC % Predicted % 107  P  RV % Predicted % 103  P    P Preliminary result    WALK:      No data to display          Imaging: Personally reviewed and as per EMR discussion this note No results found.  Lab Results: Personally reviewed CBC    Component Value Date/Time   WBC 8.9 03/02/2014 0856   RBC 4.87 03/02/2014 0856   HGB 17.7 (H) 03/02/2014  0856   HCT 49.8 03/02/2014 0856   PLT 216 03/02/2014 0856   MCV 102.3 (H) 03/02/2014 0856   MCH 36.3 (H) 03/02/2014 0856   MCHC 35.5 03/02/2014 0856   RDW 13.9 03/02/2014 0856    BMET    Component Value Date/Time   NA 141 03/02/2014 0856   K 4.6 03/02/2014 0856   CL 100 03/02/2014 0856   CO2 27 03/02/2014 0856   GLUCOSE 118 (H) 03/02/2014 0856   BUN 17 03/02/2014 0856   CREATININE 1.04 03/02/2014 0856   CALCIUM 9.0 03/02/2014 0856   GFRNONAA 76 (L) 03/02/2014 0856   GFRAA 88 (L) 03/02/2014 0856    BNP No results found for: "BNP"  ProBNP No results found for: "PROBNP"  Specialty Problems       Pulmonary Problems   Lung infiltrate    Allergies  Allergen Reactions   Other Itching   Hydrocodone Itching     There is no immunization history on file for this patient.  Past Medical History:  Diagnosis Date   Anxiety    Diabetes mellitus without complication (HCC)    Keratitis sicca, right eye    Pneumonia 10/2013   Seasonal allergies     Tobacco History: Social History   Tobacco Use  Smoking Status Every Day   Current packs/day: 1.00   Average packs/day: 1 pack/day  for 40.0 years (40.0 ttl pk-yrs)   Types: Cigarettes  Smokeless Tobacco Not on file   Ready to quit: Not Answered Counseling given: Not Answered   Continue to not smoke  Outpatient Encounter Medications as of 10/16/2023  Medication Sig   Calcium-Vitamin D (CALTRATE 600 PLUS-VIT D PO) Take by mouth daily.   celecoxib (CELEBREX) 200 MG capsule Take by mouth.   ciclopirox (PENLAC) 8 % solution Apply topically at bedtime. Apply over nail and surrounding skin. Apply daily over previous coat. After seven (7) days, may remove with alcohol and continue cycle.   irbesartan (AVAPRO) 150 MG tablet Take 150 mg by mouth daily.   LORazepam (ATIVAN) 0.5 MG tablet    metFORMIN (GLUCOPHAGE-XR) 500 MG 24 hr tablet    NOVOFINE 32G X 6 MM MISC    Omega-3 Fatty Acids (FISH OIL PO) Take 2 capsules by mouth 2 (two) times daily.   OVER THE COUNTER MEDICATION Take 1 tablet by mouth daily. Super B Energy Complex vitamin   Probiotic Product (SOLUBLE FIBER/PROBIOTICS PO) Take 1 capsule by mouth daily.   rosuvastatin (CRESTOR) 20 MG tablet    Tiotropium Bromide-Olodaterol (STIOLTO RESPIMAT) 2.5-2.5 MCG/ACT AERS Inhale 2 puffs into the lungs daily.   TRESIBA FLEXTOUCH 100 UNIT/ML FlexTouch Pen    valACYclovir (VALTREX) 1000 MG tablet Take 1,000 mg by mouth daily.   [DISCONTINUED] aspirin 81 MG EC tablet Take 81 mg by mouth daily. Swallow whole. (Patient not taking: Reported on 07/09/2023)   [DISCONTINUED] BIOTIN PO Take 40 mg by mouth daily. (Patient not taking: Reported on 07/09/2023)   [DISCONTINUED] buPROPion (WELLBUTRIN SR) 100 MG 12 hr tablet TAKE 1 TABLET BY MOUTH EVERY MORNING TWICE A DAY (Patient not taking: Reported on 07/09/2023)   [DISCONTINUED] Cranberry 450 MG TABS Take 900 mg by mouth daily. (Patient not taking: Reported on 07/09/2023)   [DISCONTINUED] diazepam (VALIUM) 2 MG tablet  (Patient not taking: Reported on 07/09/2023)   [DISCONTINUED] montelukast (SINGULAIR) 10 MG tablet Take 10 mg by mouth daily  as needed (allergies).  (Patient not taking: Reported on 07/09/2023)   [DISCONTINUED] Multiple Vitamin (MULTIVITAMIN) capsule Take  1 capsule by mouth daily. (Patient not taking: Reported on 07/09/2023)   [DISCONTINUED] nicotine (NICODERM CQ - DOSED IN MG/24 HOURS) 14 mg/24hr patch PLACE 1 PATCH ONTO SKIN EVERY DAY (Patient not taking: Reported on 07/09/2023)   [DISCONTINUED] sertraline (ZOLOFT) 50 MG tablet Take 50 mg by mouth daily.  (Patient not taking: Reported on 07/09/2023)   [DISCONTINUED] SYSTANE ULTRA 0.4-0.3 % SOLN SMARTSIG:1 Drop(s) In Eye(s) PRN (Patient not taking: Reported on 07/09/2023)   [DISCONTINUED] traMADol (ULTRAM) 50 MG tablet 1-2 tabs po q6 hours prn pain (Patient not taking: Reported on 07/09/2023)   [DISCONTINUED] vitamin E 400 UNIT capsule Take 400 Units by mouth daily. (Patient not taking: Reported on 07/09/2023)   No facility-administered encounter medications on file as of 10/16/2023.     Review of Systems  Review of Systems  N/a Physical Exam  BP 120/70 (BP Location: Right Arm, Cuff Size: Normal)   Pulse 85   Ht 5\' 7"  (1.702 m)   Wt 185 lb (83.9 kg)   SpO2 94%   BMI 28.98 kg/m   Wt Readings from Last 5 Encounters:  10/16/23 185 lb (83.9 kg)  10/16/23 185 lb (83.9 kg)  07/26/23 160 lb 12.8 oz (72.9 kg)  05/14/20 157 lb (71.2 kg)  05/04/16 192 lb (87.1 kg)    BMI Readings from Last 5 Encounters:  10/16/23 28.98 kg/m  10/16/23 28.98 kg/m  07/26/23 24.45 kg/m  05/14/20 23.87 kg/m  05/04/16 29.19 kg/m     Physical Exam General: Sitting in chair, no distress Eyes: EOMI, active Neck: Supple, no JVD Pulmonary: Clear, distant Cardiovascular warm, no edema Abdomen:nondistended, bowel sounds sounds present Extremities: No synovitis, no joint effusion Neuro: No deficits, normal gait Psych: Normal mood, full affect   Assessment & Plan:   Dyspnea on exertion: Possibly related to aging, deconditioning.  Fairly significant emphysema on CT scan 2015  with ongoing cigarette use.  High suspicion for smoking-related lung disease.  PFTs very mild COPD FEV1 greater 80%, no air trapping or hyperinflation.  No improvement Stiolto.  He denies significant dyspnea at follow-up visit.  Chronic cough: Mucus production.  He attributes to cigarette smoking, smoker's cough.  Worse in the morning.  Not better with Stiolto.  Escalate the [facility], if no better, unlikely additional therapies will help.  Smoking cessation may help.   Return if symptoms worsen or fail to improve.   Karren Burly, MD 10/16/2023

## 2023-10-16 NOTE — Patient Instructions (Signed)
Full PFT Performed Today  

## 2023-10-16 NOTE — Progress Notes (Signed)
Full PFT Performed Today  

## 2023-10-16 NOTE — Patient Instructions (Signed)
Nice to see you again  Your pulmonary function test numbers overall look good, very mild COPD but is present.  Rest of numbers look good.  Try new inhaler, Breztri 2 puffs in the morning and 2 puffs in the evening.  Rinse her mouth out with water after every use.  See if this helps with cough, congestion, mucus production.  If it helps send me a message and I can prescribe and arrange ongoing follow-up.  If it does not help, then unlikely that other inhalers or changes in medicines will help.  It may just be what it is.  If that is the case, we can follow-up just as needed.

## 2023-11-07 DIAGNOSIS — R008 Other abnormalities of heart beat: Secondary | ICD-10-CM | POA: Diagnosis not present

## 2023-11-07 DIAGNOSIS — E119 Type 2 diabetes mellitus without complications: Secondary | ICD-10-CM | POA: Diagnosis not present

## 2023-11-07 DIAGNOSIS — Z794 Long term (current) use of insulin: Secondary | ICD-10-CM | POA: Diagnosis not present

## 2023-11-07 DIAGNOSIS — J439 Emphysema, unspecified: Secondary | ICD-10-CM | POA: Diagnosis not present

## 2023-11-07 DIAGNOSIS — F430 Acute stress reaction: Secondary | ICD-10-CM | POA: Diagnosis not present

## 2024-01-08 DIAGNOSIS — E119 Type 2 diabetes mellitus without complications: Secondary | ICD-10-CM | POA: Diagnosis not present

## 2024-01-08 DIAGNOSIS — H21233 Degeneration of iris (pigmentary), bilateral: Secondary | ICD-10-CM | POA: Diagnosis not present

## 2024-01-08 DIAGNOSIS — H40011 Open angle with borderline findings, low risk, right eye: Secondary | ICD-10-CM | POA: Diagnosis not present

## 2024-01-08 DIAGNOSIS — H04123 Dry eye syndrome of bilateral lacrimal glands: Secondary | ICD-10-CM | POA: Diagnosis not present

## 2024-01-08 DIAGNOSIS — H401121 Primary open-angle glaucoma, left eye, mild stage: Secondary | ICD-10-CM | POA: Diagnosis not present

## 2024-06-18 DIAGNOSIS — Z125 Encounter for screening for malignant neoplasm of prostate: Secondary | ICD-10-CM | POA: Diagnosis not present

## 2024-06-18 DIAGNOSIS — J439 Emphysema, unspecified: Secondary | ICD-10-CM | POA: Diagnosis not present

## 2024-06-18 DIAGNOSIS — Z0000 Encounter for general adult medical examination without abnormal findings: Secondary | ICD-10-CM | POA: Diagnosis not present

## 2024-06-18 DIAGNOSIS — F17200 Nicotine dependence, unspecified, uncomplicated: Secondary | ICD-10-CM | POA: Diagnosis not present

## 2024-06-18 DIAGNOSIS — E119 Type 2 diabetes mellitus without complications: Secondary | ICD-10-CM | POA: Diagnosis not present

## 2024-06-18 DIAGNOSIS — Z6829 Body mass index (BMI) 29.0-29.9, adult: Secondary | ICD-10-CM | POA: Diagnosis not present

## 2024-06-18 DIAGNOSIS — Z794 Long term (current) use of insulin: Secondary | ICD-10-CM | POA: Diagnosis not present

## 2024-06-18 DIAGNOSIS — I10 Essential (primary) hypertension: Secondary | ICD-10-CM | POA: Diagnosis not present

## 2024-06-18 DIAGNOSIS — F430 Acute stress reaction: Secondary | ICD-10-CM | POA: Diagnosis not present

## 2024-06-18 DIAGNOSIS — E7800 Pure hypercholesterolemia, unspecified: Secondary | ICD-10-CM | POA: Diagnosis not present

## 2024-07-07 DIAGNOSIS — H401121 Primary open-angle glaucoma, left eye, mild stage: Secondary | ICD-10-CM | POA: Diagnosis not present

## 2024-07-07 DIAGNOSIS — H21233 Degeneration of iris (pigmentary), bilateral: Secondary | ICD-10-CM | POA: Diagnosis not present

## 2024-07-07 DIAGNOSIS — H40011 Open angle with borderline findings, low risk, right eye: Secondary | ICD-10-CM | POA: Diagnosis not present

## 2024-07-22 DIAGNOSIS — C670 Malignant neoplasm of trigone of bladder: Secondary | ICD-10-CM | POA: Diagnosis not present

## 2024-09-16 ENCOUNTER — Other Ambulatory Visit: Payer: Self-pay | Admitting: Podiatry

## 2024-09-16 ENCOUNTER — Ambulatory Visit: Admitting: Podiatry

## 2024-09-16 ENCOUNTER — Ambulatory Visit (INDEPENDENT_AMBULATORY_CARE_PROVIDER_SITE_OTHER)

## 2024-09-16 ENCOUNTER — Encounter: Payer: Self-pay | Admitting: Podiatry

## 2024-09-16 DIAGNOSIS — L03032 Cellulitis of left toe: Secondary | ICD-10-CM

## 2024-09-16 DIAGNOSIS — L02612 Cutaneous abscess of left foot: Secondary | ICD-10-CM

## 2024-09-16 DIAGNOSIS — R52 Pain, unspecified: Secondary | ICD-10-CM | POA: Diagnosis not present

## 2024-09-16 DIAGNOSIS — L03039 Cellulitis of unspecified toe: Secondary | ICD-10-CM

## 2024-09-16 MED ORDER — DOXYCYCLINE HYCLATE 100 MG PO TABS: 100.0000 mg | ORAL_TABLET | Freq: Two times a day (BID) | ORAL | 0 refills | Status: AC

## 2024-09-16 MED ORDER — MUPIROCIN 2 % EX OINT: 1.0000 | TOPICAL_OINTMENT | Freq: Two times a day (BID) | CUTANEOUS | 2 refills | Status: AC

## 2024-09-16 NOTE — Patient Instructions (Signed)
 Soak Instructions- MAKE SURE THAT THE TEMPERATURE OF THE WATER CHECKED TO MAKE SURE IT IS NOT TOO HOT OR COLD.     THE DAY AFTER THE PROCEDURE  Place 1/4 cup of epsom salts in a quart of warm tap water.  Submerge your foot or feet with outer bandage intact for the initial soak; this will allow the bandage to become moist and wet for easy lift off.  Once you remove your bandage, continue to soak in the solution for 20 minutes.  This soak should be done twice a day.  Next, remove your foot or feet from solution, blot dry the affected area and cover.  You may use a band aid large enough to cover the area or use gauze and tape.  Apply other medications to the area as directed by the doctor such as polysporin neosporin.  IF YOUR SKIN BECOMES IRRITATED WHILE USING THESE INSTRUCTIONS, IT IS OKAY TO SWITCH TO  WHITE VINEGAR AND WATER. Or you may use antibacterial soap and water to keep the toe clean  Monitor for any signs/symptoms of infection. Call the office immediately if any occur or go directly to the emergency room. Call with any questions/concerns.

## 2024-09-16 NOTE — Progress Notes (Unsigned)
  Subjective:  Patient ID: Robert Garner, male    DOB: 03/06/1951,  MRN: [id number]  Chief Complaint  Patient presents with   Toe Injury    Rm11 Nail injury left hallux 1 week/red,swollen and throbbing/diabetic 6.2/ soaks and neosporin.    Discussed the use of AI scribe software for clinical note transcription with the patient, who gave verbal consent to proceed.  History of Present Illness Robert Garner is a 73 year old male with diabetes who presents with a painful left big toenail.  The issue with his left big toenail began a couple of weeks ago, possibly due to an injury while night hunting for coyotes. He noticed pain and drainage, possibly pus, from the area. He has applied hydrogen peroxide for cleaning but has not used antibiotics.  Only the left big toenail is affected. His blood sugar levels are fairly good, with a recent reading of 152 mg/dL after coffee consumption. His last A1c was around 6.1 or 6.2, indicating controlled diabetes.      Objective:  There were no vitals filed for this visit.  Physical Exam EXTREMITIES: Infection, swelling, and redness around the left big toenail. Pulses present in the left big toe.  General: AAO x3, NAD    Gait: Unassisted, Nonantalgic.     No images are attached to the encounter.    Results LABS Blood Glucose: 152 mg/dL (89/79/7974) Hemoglobin A1c: 6.1-6.2%  RADIOLOGY Foot X-ray: Normal   Assessment:   1. Cellulitis and abscess of toe of left foot   2. Infection of toenail      Plan:  Patient was evaluated and treated and all questions answered.  Assessment and Plan Assessment & Plan Cellulitis and paronychia of left great toe Infection with redness and odor due to trauma. X-rays negative for fracture. Toenail loose, requiring removal. - Perform left great toenail removal under local anesthesia. - Prescribe oral antibiotics. - Prescribe mupirocin ointment for topical use. - Instruct on soaking toe twice  daily with Epsom salts. - Advise keeping toe bandaged with antibiotic ointment. - Provide surgical shoe to reduce pressure.  Type 2 diabetes mellitus without complications Well-controlled with A1c of 6.1 or 6.2. Blood sugar 152 mg/dL post-coffee. - Continue current diabetes management.      Return in about 2 weeks (around 09/30/2024) for nail check/infection .   Donnice JONELLE Fees DPM

## 2024-09-24 ENCOUNTER — Ambulatory Visit: Payer: Self-pay | Admitting: Podiatry

## 2024-09-24 LAB — HOUSE ACCOUNT TRACKING

## 2024-09-24 LAB — WOUND CULTURE
MICRO NUMBER:: 17127531
SPECIMEN QUALITY:: ADEQUATE

## 2024-10-02 DIAGNOSIS — Z0000 Encounter for general adult medical examination without abnormal findings: Secondary | ICD-10-CM | POA: Diagnosis not present

## 2024-10-02 DIAGNOSIS — Z1331 Encounter for screening for depression: Secondary | ICD-10-CM | POA: Diagnosis not present

## 2024-10-02 DIAGNOSIS — Z23 Encounter for immunization: Secondary | ICD-10-CM | POA: Diagnosis not present

## 2024-10-03 ENCOUNTER — Ambulatory Visit: Admitting: Podiatry

## 2024-10-20 ENCOUNTER — Ambulatory Visit: Admitting: Podiatry

## 2024-10-20 DIAGNOSIS — E1149 Type 2 diabetes mellitus with other diabetic neurological complication: Secondary | ICD-10-CM

## 2024-10-20 DIAGNOSIS — L02612 Cutaneous abscess of left foot: Secondary | ICD-10-CM

## 2024-10-20 DIAGNOSIS — L03032 Cellulitis of left toe: Secondary | ICD-10-CM | POA: Diagnosis not present

## 2024-10-20 NOTE — Patient Instructions (Addendum)
 Gabapentin Capsules or Tablets What is this medication? GABAPENTIN (GAB a PEN tin) treats nerve pain. It may also be used to prevent and control seizures in people with epilepsy. It works by calming overactive nerves in your body. This medicine may be used for other purposes; ask your health care provider or pharmacist if you have questions. COMMON BRAND NAME(S): Active-PAC with Gabapentin, Gabarone, Gralise, Neurontin What should I tell my care team before I take this medication? They need to know if you have any of these conditions: Have or have had depression Kidney disease Lung or breathing disease, such as asthma or COPD Substance use disorder Suicidal thoughts, plans, or attempt An unusual or allergic reaction to gabapentin, other medications, foods, dyes, or preservatives Pregnant or trying to get pregnant Breastfeeding How should I use this medication? Take this medication by mouth with a glass of water. Follow the directions on the prescription label. You can take it with or without food. If it upsets your stomach, take it with food. Take your medication at regular intervals. Do not take it more often than directed. Do not stop taking except on your care team's advice. If you are directed to break the 600 or 800 mg tablets in half as part of your dose, the extra half tablet should be used for the next dose. If you have not used the extra half tablet within 28 days, it should be thrown away. A special MedGuide will be given to you by the pharmacist with each prescription and refill. Be sure to read this information carefully each time. Talk to your care team about the use of this medication in children. While this medication may be prescribed for children as young as 3 years for selected conditions, precautions do apply. Overdosage: If you think you have taken too much of this medicine contact a poison control center or emergency room at once. NOTE: This medicine is only for you. Do not  share this medicine with others. What if I miss a dose? If you miss a dose, take it as soon as you can. If it is almost time for your next dose, take only that dose. Do not take double or extra doses. What may interact with this medication? Alcohol Antihistamines for allergy, cough, and cold Certain medications for anxiety or sleep Certain medications for depression like amitriptyline, fluoxetine, sertraline Certain medications for seizures like phenobarbital, primidone Certain medications for stomach problems General anesthetics like halothane, isoflurane, methoxyflurane, propofol  Local anesthetics like lidocaine , pramoxine, tetracaine Medications that relax muscles for surgery Opioid medications for pain Phenothiazines like chlorpromazine, mesoridazine, prochlorperazine, thioridazine This list may not describe all possible interactions. Give your health care provider a list of all the medicines, herbs, non-prescription drugs, or dietary supplements you use. Also tell them if you smoke, drink alcohol, or use illegal drugs. Some items may interact with your medicine. What should I watch for while using this medication? Visit your care team for regular checks on your progress. Tell your care team if your symptoms do not start to get better or if they get worse. Do not suddenly stop taking this medication. You may develop a severe reaction. Your care team will tell you how much medication to take. If your care team wants you to stop the medication, the dose may be slowly lowered over time to avoid any side effects. This medication may affect your coordination, reaction time, or judgment. Do not drive or operate machinery until you know how this medication affects you. Sit  up or stand slowly to reduce the risk of dizzy or fainting spells. Drinking alcohol with this medication can increase the risk of these side effects. Taking this medication with other CNS depressants can make you too sleepy. This  can make it hard to breathe and stay awake. In some cases, it can cause coma and death. CNS depressants include opioids, benzodiazepines, muscle relaxants, medications for sleep, and alcohol. Talk to your care team about all the medications, vitamins, and supplements you take. They can tell you what is safe to take together. Call emergency services right away if you have slow or shallow breathing, feel dizzy or confused, or have trouble staying awake. This medication can cause new or worsening depression and increase the risk of suicidal thoughts and actions in a small number of people. This can happen while you are taking this medication or after stopping it. Talk to your care team right away if you have changes in mood or behavior or thoughts of self-harm or suicide. They can help you. This medication may cause serious skin reactions. They can happen weeks to months after starting the medication. Talk to your care team right away if you have fevers or flu-like symptoms with a rash. The rash may be red or purple and then turn into blisters or peeling of the skin. Or you might notice a red rash with swelling of the face, lips, or lymph nodes in your neck or under your arms. If you take this medication for seizures, wear a medical ID bracelet or chain. Carry a card that describes your condition. List the medications and doses you take on the card. Talk to your care team if you may be pregnant. There are benefits and risks to taking medications during pregnancy. Your care team can help you find the option that works for you. What side effects may I notice from receiving this medication? Side effects that you should report to your care team as soon as possible: Allergic reactions or angioedema--skin rash, itching or hives, swelling of the face, eyes, lips, tongue, arms, or legs, trouble swallowing or breathing Rash, fever, and swollen lymph nodes Thoughts of suicide or self-harm, worsening mood, feelings of  depression Trouble breathing Unusual changes in mood or behavior in children after use such as trouble concentrating, hostility, or restlessness Side effects that usually do not require medical attention (report these to your care team if they continue or are bothersome): Dizziness Drowsiness Nausea Swelling of the ankles, hands, or feet Vomiting This list may not describe all possible side effects. Call your doctor for medical advice about side effects. You may report side effects to FDA at 1-800-FDA-1088. Where should I keep my medication? Keep out of reach of children and pets. Store at room temperature between 15 and 30 degrees C (59 and 86 degrees F). Get rid of any unused medication after the expiration date. This medication may cause accidental overdose and death if taken by other adults, children, or pets. To get rid of medications that are no longer needed or have expired: Take the medication to a medication take-back program. Check with your pharmacy or law enforcement to find a location. If you cannot return the medication, check the label or package insert to see if the medication should be thrown out in the garbage or flushed down the toilet. If you are not sure, ask your care team. If it is safe to put it in the trash, empty the medication out of the container. Mix the medication  with cat litter, dirt, coffee grounds, or other unwanted substance. Seal the mixture in a bag or container. Put it in the trash. NOTE: This sheet is a summary. It may not cover all possible information. If you have questions about this medicine, talk to your doctor, pharmacist, or health care provider.  2025 Elsevier/Gold Standard (2024-03-31 00:00:00)  --  Diabetes Mellitus and Foot Care Diabetes, also called diabetes mellitus, may cause problems with your feet and legs because of poor blood flow (circulation). Poor circulation may make your skin: Become thinner and drier. Break more easily. Heal  more slowly. Peel and crack. You may also have nerve damage (neuropathy). This can cause decreased feeling in your legs and feet. This means that you may not notice minor injuries to your feet that could lead to more serious problems. Finding and treating problems early is the best way to prevent future foot problems. How to care for your feet Foot hygiene  Wash your feet daily with warm water and mild soap. Do not use hot water. Then, pat your feet and the areas between your toes until they are fully dry. Do not soak your feet. This can dry your skin. Trim your toenails straight across. Do not dig under them or around the cuticle. File the edges of your nails with an emery board or nail file. Apply a moisturizing lotion or petroleum jelly to the skin on your feet and to dry, brittle toenails. Use lotion that does not contain alcohol and is unscented. Do not apply lotion between your toes. Shoes and socks Wear clean socks or stockings every day. Make sure they are not too tight. Do not wear knee-high stockings. These may decrease blood flow to your legs. Wear shoes that fit well and have enough cushioning. Always look in your shoes before you put them on to be sure there are no objects inside. To break in new shoes, wear them for just a few hours a day. This prevents injuries on your feet. Wounds, scrapes, corns, and calluses  Check your feet daily for blisters, cuts, bruises, sores, and redness. If you cannot see the bottom of your feet, use a mirror or ask someone for help. Do not cut off corns or calluses or try to remove them with medicine. If you find a minor scrape, cut, or break in the skin on your feet, keep it and the skin around it clean and dry. You may clean these areas with mild soap and water. Do not clean the area with peroxide, alcohol, or iodine. If you have a wound, scrape, corn, or callus on your foot, look at it several times a day to make sure it is healing and not infected.  Check for: Redness, swelling, or pain. Fluid or blood. Warmth. Pus or a bad smell. General tips Do not cross your legs. This may decrease blood flow to your feet. Do not use heating pads or hot water bottles on your feet. They may burn your skin. If you have lost feeling in your feet or legs, you may not know this is happening until it is too late. Protect your feet from hot and cold by wearing shoes, such as at the beach or on hot pavement. Schedule a complete foot exam at least once a year or more often if you have foot problems. Report any cuts, sores, or bruises to your health care provider right away. Where to find more information American Diabetes Association: diabetes.org Association of Diabetes Care & Education Specialists:  diabeteseducator.org Contact a health care provider if: You have a condition that increases your risk of infection, and you have any cuts, sores, or bruises on your feet. You have an injury that is not healing. You have redness on your legs or feet. You feel burning or tingling in your legs or feet. You have pain or cramps in your legs and feet. Your legs or feet are numb. Your feet always feel cold. You have pain around any toenails. Get help right away if: You have a wound, scrape, corn, or callus on your foot and: You have signs of infection. You have a fever. You have a red line going up your leg. This information is not intended to replace advice given to you by your health care provider. Make sure you discuss any questions you have with your health care provider. Document Revised: 05/17/2022 Document Reviewed: 05/17/2022 Elsevier Patient Education  2024 [employer].

## 2024-10-20 NOTE — Progress Notes (Signed)
 Subjective: 73 year old male presents the office today for follow-up evaluation status post hallux toenail removal given the infection.  States he is doing well.  He is having some soreness thinks could also be from the neuropathy.  He gets tingling to his toes and some ongoing prior to this infection.  Is not had any treatment for the neuropathy.  From a toenail standpoint denies any drainage or pus.  Objective: AAO x3, NAD DP/PT pulses palpable bilaterally, CRT less than 3 seconds Procedure site appears to be healing well.  Small Mehta scabbing still present on the base of the toenail.  There is also localized surrounding erythema although moderate and likely coming more from inflammation as opposed to infection. There is no significant tenderness on exam.  No open lesions.  No pain with calf compression, swelling, warmth, erythema  Assessment: Left hallux toenail infection, improving; neuropathy  Plan: -All treatment options discussed with the patient including all alternatives, risks, complications.  -From a procedure standpoint healing well.  Recommended that if work is still applying a small amount of antibiotic ointment and a Band-Aid during the day to clean the area open at nighttime.  Discussed washing with soap and water and the scab should come off on its own over time.  There are still some minimal residual erythema but this is more from inflammation I think.  There is no drainage of pus or increasing temperature. If not improving over the next 2 weeks to let me know, or sooner if there is any worsening.  -For neuropathy standpoint we discussed various treatment options.  He send consider gabapentin.  If he desires to start this medication he will let me know but -Patient encouraged to call the office with any questions, concerns, change in symptoms.   Return in about 1 year (around 10/20/2025) for diabetic foot exam.  Donnice JONELLE Fees DPM

## 2025-07-20 ENCOUNTER — Ambulatory Visit: Admitting: Podiatry
